# Patient Record
Sex: Male | Born: 1995 | Race: White | Hispanic: No | Marital: Single | Smoking: Former smoker
Health system: Southern US, Community
[De-identification: ages and names within clinical notes are randomized; demographics above are authoritative.]

## PROBLEM LIST (undated history)

## (undated) DIAGNOSIS — L709 Acne, unspecified: Secondary | ICD-10-CM

## (undated) DIAGNOSIS — H5500 Unspecified nystagmus: Secondary | ICD-10-CM

## (undated) HISTORY — DX: Acne, unspecified: L70.9

## (undated) HISTORY — DX: Unspecified nystagmus: H55.00

---
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
## 2014-01-12 ENCOUNTER — Ambulatory Visit (INDEPENDENT_AMBULATORY_CARE_PROVIDER_SITE_OTHER): Payer: BC Managed Care – PPO | Admitting: Physician Assistant

## 2014-01-12 ENCOUNTER — Encounter: Payer: Self-pay | Admitting: Physician Assistant

## 2014-01-12 VITALS — BP 138/80 | HR 70 | Temp 99.8°F | Resp 16 | Ht 76.5 in | Wt 222.0 lb

## 2014-01-12 DIAGNOSIS — J029 Acute pharyngitis, unspecified: Secondary | ICD-10-CM

## 2014-01-12 MED ORDER — BENZONATATE 100 MG PO CAPS: 100.0000 mg | ORAL_CAPSULE | Freq: Four times a day (QID) | ORAL | Status: DC | PRN

## 2014-01-12 MED ORDER — PREDNISONE 10 MG PO TABS
ORAL_TABLET | ORAL | Status: AC
Start: 2014-01-12 — End: 2014-01-20

## 2014-01-12 MED ORDER — AZITHROMYCIN 250 MG PO TABS: ORAL_TABLET | ORAL | Status: AC

## 2014-01-12 NOTE — Patient Instructions (Signed)
-  Take prednisone as prescribed for inflammation -Take Tessalon Perles as prescribed for cough -Take Z-Pak if not better by 01/15/14. -Salt water gargles- Mix 1 tsp of table salt in warm water and then gargle and spit.  You can also do 1 TSP liquid Maalox and 1 TSP liquid benadryl- mix/ gargle/ spit  Make sure you are drinking plenty of water to stay hydrated.  If you are not better in 10-14 days, then please call the office  Pharyngitis Pharyngitis is redness, pain, and swelling (inflammation) of your pharynx.  CAUSES  Pharyngitis is usually caused by infection. Most of the time, these infections are from viruses (viral) and are part of a cold. However, sometimes pharyngitis is caused by bacteria (bacterial). Pharyngitis can also be caused by allergies. Viral pharyngitis may be spread from person to person by coughing, sneezing, and personal items or utensils (cups, forks, spoons, toothbrushes). Bacterial pharyngitis may be spread from person to person by more intimate contact, such as kissing.  SIGNS AND SYMPTOMS  Symptoms of pharyngitis include:   Sore throat.   Tiredness (fatigue).   Low-grade fever.   Headache.  Joint pain and muscle aches.  Skin rashes.  Swollen lymph nodes.  Plaque-like film on throat or tonsils (often seen with bacterial pharyngitis). DIAGNOSIS  Your health care provider will ask you questions about your illness and your symptoms. Your medical history, along with a physical exam, is often all that is needed to diagnose pharyngitis. Sometimes, a rapid strep test is done. Other lab tests may also be done, depending on the suspected cause.  TREATMENT  Viral pharyngitis will usually get better in 3-4 days without the use of medicine. Bacterial pharyngitis is treated with medicines that kill germs (antibiotics).  HOME CARE INSTRUCTIONS   Drink enough water and fluids to keep your urine clear or pale yellow.   Only take over-the-counter or prescription  medicines as directed by your health care provider:   If you are prescribed antibiotics, make sure you finish them even if you start to feel better.   Do not take aspirin.   Get lots of rest.   Gargle with 8 oz of salt water ( tsp of salt per 1 qt of water) as often as every 1-2 hours to soothe your throat.   Throat lozenges (if you are not at risk for choking) or sprays may be used to soothe your throat. SEEK MEDICAL CARE IF:   You have large, tender lumps in your neck.  You have a rash.  You cough up green, yellow-brown, or bloody spit. SEEK IMMEDIATE MEDICAL CARE IF:   Your neck becomes stiff.  You drool or are unable to swallow liquids.  You vomit or are unable to keep medicines or liquids down.  You have severe pain that does not go away with the use of recommended medicines.  You have trouble breathing (not caused by a stuffy nose). MAKE SURE YOU:   Understand these instructions.  Will watch your condition.  Will get help right away if you are not doing well or get worse. Document Released: 02/04/2005 Document Revised: 11/25/2012 Document Reviewed: 10/12/2012 [facility] Patient Information 2015 [employer], [place]. This information is not intended to replace advice given to you by your health care provider. Make sure you discuss any questions you have with your health care provider.

## 2014-01-12 NOTE — Progress Notes (Signed)
Subjective:    Patient ID: Lacy DuverneyRyan D Belisle, male    DOB: 1995/07/28, 18 y.o.   MRN: [id number]  Cough This is a new problem. Episode onset: 1 week. The problem has been gradually worsening. The problem occurs constantly. Cough characteristics: Productive with little green. Associated symptoms include a fever, rhinorrhea, a sore throat and wheezing. Pertinent negatives include no chest pain, chills, ear congestion, ear pain, myalgias, nasal congestion, postnasal drip, rash, shortness of breath or sweats. Nothing aggravates the symptoms. Treatments tried: Mucus Relief like Mucinex.   Review of Systems  Constitutional: Positive for fever. Negative for chills, diaphoresis and fatigue.  HENT: Positive for nosebleeds, rhinorrhea and sore throat. Negative for congestion, ear discharge, ear pain, postnasal drip, sinus pressure, trouble swallowing and voice change.   Eyes: Negative.   Respiratory: Positive for cough and wheezing. Negative for chest tightness and shortness of breath.   Cardiovascular: Negative.  Negative for chest pain.  Gastrointestinal: Negative.   Musculoskeletal: Negative.  Negative for myalgias.  Skin: Negative.  Negative for rash.  Neurological: Negative.   Psychiatric/Behavioral: Negative.    No past medical history on file. No current outpatient prescriptions on file prior to visit.   No current facility-administered medications on file prior to visit.   No Known Allergies    BP 138/80 mmHg  Pulse 70  Temp(Src) 99.8 F (37.7 C) (Temporal)  Resp 16  Ht 6' 4.5" (1.943 m)  Wt 222 lb (100.699 kg)  BMI 26.67 kg/m2  SpO2 99% Wt Readings from Last 3 Encounters:  01/12/14 222 lb (100.699 kg) (97 %*, Z = 1.91)   * Growth percentiles are based on CDC 2-20 Years data.   Objective:   Physical Exam  Constitutional: He is oriented to person, place, and time. He appears well-developed and well-nourished. He has a sickly appearance. No distress.  HENT:  Head: Normocephalic.   Right Ear: Tympanic membrane, external ear and ear canal normal.  Left Ear: Tympanic membrane, external ear and ear canal normal.  Nose: No mucosal edema, rhinorrhea or sinus tenderness. Epistaxis is observed. Right sinus exhibits no maxillary sinus tenderness and no frontal sinus tenderness. Left sinus exhibits no maxillary sinus tenderness and no frontal sinus tenderness.  Mouth/Throat: Uvula is midline and mucous membranes are normal. Mucous membranes are not pale and not dry. No trismus in the jaw. No uvula swelling. Posterior oropharyngeal erythema present. No oropharyngeal exudate, posterior oropharyngeal edema or tonsillar abscesses.  Dried blood seen on exam in left nare.  Turbinates were non-erythematous and non-edematous bilaterally.  Eyes: Conjunctivae and lids are normal. Pupils are equal, round, and reactive to light. Right eye exhibits no discharge. Left eye exhibits no discharge. No scleral icterus.  Neck: Trachea normal, normal range of motion and phonation normal. Neck supple. No tracheal tenderness present. No tracheal deviation present.  Cardiovascular: Normal rate, regular rhythm, S1 normal, S2 normal, normal heart sounds and normal pulses.  Exam reveals no gallop, no distant heart sounds and no friction rub.   No murmur heard. Pulmonary/Chest: Effort normal and breath sounds normal. No stridor. No respiratory distress. He has no decreased breath sounds. He has no wheezes. He has no rhonchi. He has no rales. He exhibits no tenderness.  Abdominal: Soft. Bowel sounds are normal. There is no tenderness. There is no rebound and no guarding.  Lymphadenopathy:  No tenderness or LAD.  Neurological: He is alert and oriented to person, place, and time. Gait normal.  Skin: Skin is warm, dry and intact. No  rash noted. He is not diaphoretic.  Psychiatric: He has a normal mood and affect. His speech is normal and behavior is normal. Judgment and thought content normal. Cognition and memory  are normal.  Vitals reviewed.  Assessment & Plan:  1. Acute pharyngitis, unspecified pharyngitis type- Most likely viral  -Take prednisone as prescribed for inflammation- predniSONE (DELTASONE) 10 MG tablet; Take 3 tablets PO for 2 days, then take 2 tablets PO for 2 days, then take 1 tablet PO for 3 days.  Dispense: 13 tablet; Refill: 0 - Take Tessalon Perles as prescribed for cough- benzonatate (TESSALON PERLES) 100 MG capsule; Take 1 capsule (100 mg total) by mouth every 6 (six) hours as needed for cough.  Dispense: 60 capsule; Refill: 1 - Start taking Z-Pak on 01/15/14 if you are not feeling better- azithromycin (ZITHROMAX) 250 MG tablet; Take 2 tablets PO on day 1, then 1 tablet PO Q24H x 4 days  Dispense: 6 tablet; Refill: 0  Discussed medication effects and SE's.  Pt agreed to treatment plan. If you are not feeling better in 10-14 days, then please call the office.  Freddie Dymek, Lise AuerJennifer L, PA-C 12:52 PM [facility]

## 2017-06-24 ENCOUNTER — Encounter: Payer: Self-pay | Admitting: Adult Health

## 2017-06-24 ENCOUNTER — Ambulatory Visit: Payer: 59 | Admitting: Adult Health

## 2017-06-24 VITALS — BP 134/82 | HR 76 | Temp 97.9°F | Ht 77.5 in | Wt 222.0 lb

## 2017-06-24 DIAGNOSIS — J351 Hypertrophy of tonsils: Secondary | ICD-10-CM

## 2017-06-24 DIAGNOSIS — J00 Acute nasopharyngitis [common cold]: Secondary | ICD-10-CM

## 2017-06-24 MED ORDER — PROMETHAZINE-DM 6.25-15 MG/5ML PO SYRP: 5.0000 mL | ORAL_SOLUTION | Freq: Four times a day (QID) | ORAL | 1 refills | Status: DC | PRN

## 2017-06-24 MED ORDER — AZITHROMYCIN 250 MG PO TABS: ORAL_TABLET | ORAL | 1 refills | Status: AC

## 2017-06-24 MED ORDER — PREDNISONE 20 MG PO TABS: ORAL_TABLET | ORAL | 0 refills | Status: DC

## 2017-06-24 NOTE — Progress Notes (Signed)
Assessment and Plan:  Rumeal was seen today for sinusitis, nasal congestion and generalized body aches.  Diagnoses and all orders for this visit:   Acute nasopharyngitis - Discussed the importance of avoiding unnecessary antibiotic therapy. Suggested symptomatic OTC remedies. Nasal saline spray for congestion. Nasal steroids, allergy pill, oral steroids Follow up as needed. -     promethazine-dextromethorphan (PROMETHAZINE-DM) 6.25-15 MG/5ML syrup; Take 5 mLs by mouth 4 (four) times daily as needed for cough. -     predniSONE (DELTASONE) 20 MG tablet; 2 tablets daily for 3 days, 1 tablet daily for 4 days.   Fill and take only if symptoms getting worse not better ~9-10 days:  -     azithromycin (ZITHROMAX) 250 MG tablet; Take 2 tablets (500 mg) on  Day 1,  followed by 1 tablet (250 mg) once daily on Days 2 through 5.  Single enlarged tonsil  Right tonsil asymmetrically large compared to left without erythema/discharge He will ask dentist/mom if this has been noted prior - if not, contact back and may order imaging to investigate further  Further disposition pending results of labs. Discussed med's effects and SE's.   Over 15 minutes of exam, counseling, chart review, and critical decision making was performed.   Future Appointments  Date Time Provider Department Center  12/30/2017  3:00 PM Judd Gaudier, NP [facility] None    ------------------------------------------------------------------------------------------------------------------   HPI BP 134/82   Pulse 76   Temp 97.9 F (36.6 C)   Ht 6' 5.5" (1.969 m)   Wt 222 lb (100.7 kg)   SpO2 98%   BMI 25.99 kg/m   22 y.o.male presents for URI symptoms x 2 days- woke up yesterday with a very sore throat which has since faded, he reports nasal congestion, mild headache, mild sense of chest congestion, runny nose. Denies fever, endorses mild chills, mild body aches. Does endorse mild nausea but no emesis or diarrhea.   He  reports typically has seasonal allergies, but has not had symptoms this year. Has not been on medication this year.    No past medical history on file.   No Known Allergies  Current Outpatient Medications on File Prior to Visit  Medication Sig  . benzonatate (TESSALON PERLES) 100 MG capsule Take 1 capsule (100 mg total) by mouth every 6 (six) hours as needed for cough.   No current facility-administered medications on file prior to visit.     ROS: Review of Systems  Constitutional: Positive for chills. Negative for diaphoresis, fever and malaise/fatigue.  HENT: Positive for congestion and sore throat. Negative for ear discharge, ear pain, hearing loss, sinus pain and tinnitus.   Eyes: Negative for blurred vision, pain, discharge and redness.  Respiratory: Negative for cough, hemoptysis, sputum production, shortness of breath, wheezing and stridor.   Cardiovascular: Negative for chest pain, palpitations and orthopnea.  Gastrointestinal: Positive for nausea. Negative for abdominal pain, diarrhea and vomiting.  Genitourinary: Negative.   Musculoskeletal: Negative for joint pain and myalgias.  Skin: Negative for rash.  Neurological: Negative for dizziness, sensory change, weakness and headaches.  Endo/Heme/Allergies: Negative for environmental allergies.  Psychiatric/Behavioral: Negative.   All other systems reviewed and are negative.   Physical Exam:  BP 134/82   Pulse 76   Temp 97.9 F (36.6 C)   Ht 6' 5.5" (1.969 m)   Wt 222 lb (100.7 kg)   SpO2 98%   BMI 25.99 kg/m   General Appearance: Well nourished, in no apparent distress. Eyes: PERRLA, EOMs, conjunctiva no  swelling or erythema Sinuses: No Frontal/maxillary tenderness ENT/Mouth: Ext aud canals clear, TMs without erythema, bulging. No erythema, swelling, or exudate on post pharynx.  Right tonsil 3+ without erythema/discharge, uvula midline, left tonsil normal. Hearing normal.  Neck: Supple Respiratory: Respiratory  effort normal, BS equal bilaterally without rales, rhonchi, wheezing or stridor.  Cardio: RRR with no MRGs. Brisk peripheral pulses without edema.  Abdomen: Soft, + BS.  Non tender, no guarding, rebound, hernias, masses. Lymphatics: Non tender without lymphadenopathy.  Musculoskeletal: Symmetrical strength, normal gait.  Skin: Warm, dry without rashes, lesions, ecchymosis.  Neuro: Cranial nerves intact. Normal muscle tone, no cerebellar symptoms. Sensation intact.  Psych: Awake and oriented X 3, normal affect, Insight and Judgment appropriate.     Dan Maker, NP 4:45 PM [facility]

## 2017-06-24 NOTE — Patient Instructions (Addendum)
Fill and take antibiotic if not getting better by day 7-8    HOW TO TREAT VIRAL COUGH AND COLD SYMPTOMS:  -Symptoms usually last at least 1 week with the worst symptoms being around day 4.  - colds usually start with a sore throat and end with a cough, and the cough can take 2 weeks to get better.  -No antibiotics are needed for colds, flu, sore throats, cough, bronchitis UNLESS symptoms are longer than 7 days OR if you are getting better then get drastically worse.  -There are a lot of combination medications (Dayquil, Nyquil, Vicks 44, tyelnol cold and sinus, ETC). Please look at the ingredients on the back so that you are treating the correct symptoms and not doubling up on medications/ingredients.    Medicines you can use  Nasal congestion  Little Remedies saline spray (aerosol/mist)- can try this, it is in the kids section - pseudoephedrine (Sudafed)- behind the counter, do not use if you have high blood pressure, medicine that have -D in them.  - phenylephrine (Sudafed PE) -Dextormethorphan + chlorpheniramine (Coridcidin HBP)- okay if you have high blood pressure -Oxymetazoline (Afrin) nasal spray- LIMIT to 3 days -Saline nasal spray -Neti pot (used distilled or bottled water)  Ear pain/congestion  -pseudoephedrine (sudafed) - Nasonex/flonase nasal spray  Fever  -Acetaminophen (Tyelnol) -Ibuprofen (Advil, motrin, aleve)  Sore Throat  -Acetaminophen (Tyelnol) -Ibuprofen (Advil, motrin, aleve) -Drink a lot of water -Gargle with salt water - Rest your voice (don't talk) -Throat sprays -Cough drops  Body Aches  -Acetaminophen (Tyelnol) -Ibuprofen (Advil, motrin, aleve)  Headache  -Acetaminophen (Tyelnol) -Ibuprofen (Advil, motrin, aleve) - Exedrin, Exedrin Migraine  Allergy symptoms (cough, sneeze, runny nose, itchy eyes) -Claritin or loratadine cheapest but likely the weakest  -Zyrtec or certizine at night because it can make you sleepy -The strongest is allegra  or fexafinadine  Cheapest at [employer], [employer], [employer]  Cough  -Dextromethorphan (Delsym)- medicine that has DM in it -Guafenesin (Mucinex/Robitussin) - cough drops - drink lots of water  Chest Congestion  -Guafenesin (Mucinex/Robitussin)  Red Itchy Eyes  - Naphcon-A  Upset Stomach  - Bland diet (nothing spicy, greasy, fried, and high acid foods like tomatoes, oranges, berries) -OKAY- cereal, bread, soup, crackers, rice -Eat smaller more frequent meals -reduce caffeine, no alcohol -Loperamide (Imodium-AD) if diarrhea -Prevacid for heart burn  General health when sick  -Hydration -wash your hands frequently -keep surfaces clean -change pillow cases and sheets often -Get fresh air but do not exercise strenuously -Vitamin D, double up on it - Vitamin C -Zinc

## 2017-09-19 DIAGNOSIS — H5501 Congenital nystagmus: Secondary | ICD-10-CM | POA: Diagnosis not present

## 2017-12-29 NOTE — Progress Notes (Signed)
Complete Physical  Assessment and Plan:  Walter Clark was seen today for annual exam.  Diagnoses and all orders for this visit:  Encounter for routine adult health examination without abnormal findings General health guidelines and recommendations reviewed, discussed safe sex, seatbelt use, substance use, sunscreen use, diet/exercise, water intake, caffeine use.   Medication management -     CBC with Differential/Platelet -     COMPLETE METABOLIC PANEL WITH GFR -     Urinalysis, Routine w reflex microscopic -     Magnesium  Screening for diabetes mellitus -     Hemoglobin A1c  Screening cholesterol level -     Lipid panel  Screening for thyroid disorder -     TSH  Vitamin D deficiency -     VITAMIN D 25 Hydroxy (Vit-D Deficiency, Fractures)  Screening for hematuria or proteinuria -     Urinalysis, Routine w reflex microscopic -     Microalbumin / creatinine urine ratio   Discussed med's effects and SE's. Screening labs and tests as requested with regular follow-up as recommended. Over 40 minutes of exam, counseling, chart review and critical decision making was performed  Future Appointments  Date Time Provider Department Center  01/04/2019  3:00 PM Judd Gaudier, NP [facility] None     HPI 22 y.o. male presents for a complete physical. He has no significant medical history, had acne treated by accutane as teen. 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 Has a steady girlfriend of 2 years. No kids. Has a cat. Declines STD testing today.   He runs, started crossfit recently, active 5 days a week, 1-1.5 hours   BMI is Body mass index is 27.16 kg/m., he has been working on diet and exercise. He typically skips breakfast, will have black coffee x 2 cups, has bowl of cottage cheese with fruit mid morning. For lunch has chipotle, chooses healthy options, brown rice etc. Or will take from home, ground [place], brown  rice, veggies. He drinks 5-6 x 16 fluid ounces water daily. He sleeps 7-7.5 hours nightly.  Wt Readings from Last 3 Encounters:  12/30/17 229 lb (103.9 kg)  06/24/17 222 lb (100.7 kg)  01/12/14 222 lb (100.7 kg) (97 %, Z= 1.91)*   * Growth percentiles are based on CDC (Boys, 2-20 Years) data.   Today their BP is BP: 122/76  He denies chest pain, shortness of breath, dizziness.     Current Medications:  Current Outpatient Medications on File Prior to Visit  Medication Sig Dispense Refill  . benzonatate (TESSALON PERLES) 100 MG capsule Take 1 capsule (100 mg total) by mouth every 6 (six) hours as needed for cough. 60 capsule 1  . predniSONE (DELTASONE) 20 MG tablet 2 tablets daily for 3 days, 1 tablet daily for 4 days. 10 tablet 0  . promethazine-dextromethorphan (PROMETHAZINE-DM) 6.25-15 MG/5ML syrup Take 5 mLs by mouth 4 (four) times daily as needed for cough. 240 mL 1   No current facility-administered medications on file prior to visit.    Allergies:  No Known Allergies Health Maintenance:   There is no immunization history on file for this patient.  Tetanus: ? 2014 Flu vaccine: Today  HPV: ?   Colonoscopy: EGD:  Eye Exam: Dr. Emily Filbert, glasses/contacts, last visit 2019 Dentist: Dr. Marland Kitchen lat 2019, goes q37m Derm: last 2017  Patient Care Team: Lucky Cowboy, MD as PCP - General (Internal Medicine)  Medical History:  does not have any active problems on file. Surgical History:  He  has a past surgical history that includes Eye muscle surgery (Bilateral, 1999) and Knee surgery (Right, 2013). Family History:  His family history includes Bipolar disorder in his father; Breast cancer in his paternal grandmother; Heart attack in his paternal grandfather; Hypertension in his brother and father; Leukemia in his maternal aunt; Liver disease in his maternal grandmother; Migraines in his mother; Parkinson's disease in his maternal grandfather; Stroke in his mother. Social History:    reports that he quit smoking about 3 years ago. His smoking use included cigarettes. He started smoking about 4 years ago. He has a 0.25 pack-year smoking history. He has quit using smokeless tobacco. He reports that he drinks about 10.0 standard drinks of alcohol per week. He reports that he has current or past drug history. Drug: Marijuana. Review of Systems:  Review of Systems  Constitutional: Negative for malaise/fatigue and weight loss.  HENT: Negative for hearing loss and tinnitus.   Eyes: Negative for blurred vision and double vision.  Respiratory: Negative for cough, shortness of breath and wheezing.   Cardiovascular: Negative for chest pain, palpitations, orthopnea, claudication and leg swelling.  Gastrointestinal: Negative for abdominal pain, blood in stool, constipation, diarrhea, heartburn, melena, nausea and vomiting.  Genitourinary: Negative.   Musculoskeletal: Negative for joint pain and myalgias.  Skin: Negative for rash.  Neurological: Negative for dizziness, tingling, sensory change, weakness and headaches.  Endo/Heme/Allergies: Negative for polydipsia.  Psychiatric/Behavioral: Negative.   All other systems reviewed and are negative.   Physical Exam: Estimated body mass index is 27.16 kg/m as calculated from the following:   Height as of this encounter: 6\' 5"  (1.956 m).   Weight as of this encounter: 229 lb (103.9 kg). BP 122/76   Pulse 65   Temp 97.7 F (36.5 C)   Ht 6\' 5"  (1.956 m)   Wt 229 lb (103.9 kg)   SpO2 96%   BMI 27.16 kg/m  General Appearance: Well nourished, in no apparent distress.  Eyes: PERRLA, EOMs, conjunctiva no swelling or erythema, normal fundi and vessels.  Sinuses: No Frontal/maxillary tenderness  ENT/Mouth: Ext aud canals clear, normal light reflex with TMs without erythema, bulging. Good dentition. No erythema, swelling, or exudate on post pharynx. Tonsils not swollen or erythematous. Hearing normal.  Neck: Supple, thyroid normal. No  bruits  Respiratory: Respiratory effort normal, BS equal bilaterally without rales, rhonchi, wheezing or stridor.  Cardio: RRR without murmurs, rubs or gallops. Brisk peripheral pulses without edema.  Chest: symmetric, with normal excursions and percussion.  Abdomen: Soft, nontender, no guarding, rebound, hernias, masses, or organomegaly.  Lymphatics: Non tender without lymphadenopathy.  Genitourinary: Defer Musculoskeletal: Full ROM all peripheral extremities,5/5 strength, and normal gait.  Skin: Warm, dry without rashes, lesions, ecchymosis. Neuro: Cranial nerves intact, reflexes equal bilaterally. Normal muscle tone, no cerebellar symptoms. Sensation intact.  Psych: Awake and oriented X 3, normal affect, Insight and Judgment appropriate.   EKG: Defer  Dan Maker 4:07 PM [facility]

## 2017-12-30 ENCOUNTER — Ambulatory Visit: Payer: 59 | Admitting: Adult Health

## 2017-12-30 ENCOUNTER — Encounter: Payer: Self-pay | Admitting: Adult Health

## 2017-12-30 VITALS — BP 122/76 | HR 65 | Temp 97.7°F | Ht 77.0 in | Wt 229.0 lb

## 2017-12-30 DIAGNOSIS — Z1329 Encounter for screening for other suspected endocrine disorder: Secondary | ICD-10-CM

## 2017-12-30 DIAGNOSIS — Z131 Encounter for screening for diabetes mellitus: Secondary | ICD-10-CM

## 2017-12-30 DIAGNOSIS — Z13220 Encounter for screening for lipoid disorders: Secondary | ICD-10-CM

## 2017-12-30 DIAGNOSIS — Z79899 Other long term (current) drug therapy: Secondary | ICD-10-CM | POA: Diagnosis not present

## 2017-12-30 DIAGNOSIS — E559 Vitamin D deficiency, unspecified: Secondary | ICD-10-CM

## 2017-12-30 DIAGNOSIS — Z0000 Encounter for general adult medical examination without abnormal findings: Secondary | ICD-10-CM

## 2017-12-30 DIAGNOSIS — Z1389 Encounter for screening for other disorder: Secondary | ICD-10-CM

## 2017-12-30 DIAGNOSIS — L709 Acne, unspecified: Secondary | ICD-10-CM

## 2017-12-30 HISTORY — DX: Acne, unspecified: L70.9

## 2017-12-30 NOTE — Patient Instructions (Addendum)
Ask pediatrician/mom about guardesil (HPV vaccines)    [name] , Thank you for taking time to come for your Medicare Wellness Visit. I appreciate your ongoing commitment to your health goals. Please review the following plan we discussed and let me know if I can assist you in the future.   These are the goals we discussed: Goals    . DIET - EAT MORE FRUITS AND VEGETABLES     7+ daily servings; aim to get in green leafy vegetables, cruciferous, bright fruits, beans.        This is a list of the screening recommended for you and due dates:  Health Maintenance  Topic Date Due  . Tetanus Vaccine  12/30/2020*  . Flu Shot  Completed  . HIV Screening  Discontinued  *Topic was postponed. The date shown is not the original due date.    Know what a healthy weight is for you (roughly BMI <25) and aim to maintain this  Ideal diet is a plant-based (mainly plants), whole foods (minimally processed) diet  Aim for 7+ servings of fruits and vegetables daily   65-80+ fluid ounces of water or unsweet tea for healthy kidneys  Try to limit to max 2-3 drink of alcohol per day, avoid binge drinking; avoid smoking/tobacco  Limit animal fats in diet for cholesterol and heart health - choose grass fed whenever available  Avoid highly processed foods, and foods high in saturated/trans fats  Aim for low stress - take time to unwind and care for your mental health  Aim for 150+ min of moderate intensity exercise weekly for heart health, and weights twice weekly for bone health  Aim for 7-9 hours of sleep daily      When it comes to diets, agreement about the perfect plan isn't easy to find, even among the experts. Experts at the [facility] [employer] developed an idea known as the Healthy Eating Plate. Just imagine a plate divided into logical, healthy portions.  The emphasis is on diet quality:  Load up on vegetables and fruits - one-half of your plate: Aim for color and variety,  and remember that potatoes don't count.  Go for whole grains - one-quarter of your plate: Whole wheat, barley, wheat berries, quinoa, oats, brown rice, and foods made with them. If you want pasta, go with whole wheat pasta.  Protein power - one-quarter of your plate: Fish, chicken, beans, and nuts are all healthy, versatile protein sources. Limit red meat.  The diet, however, does go beyond the plate, offering a few other suggestions.  Use healthy plant oils, such as olive, canola, soy, corn, sunflower and peanut. Check the labels, and avoid partially hydrogenated oil, which have unhealthy trans fats.  If you're thirsty, drink water. Coffee and tea are good in moderation, but skip sugary drinks and limit milk and dairy products to one or two daily servings.  The type of carbohydrate in the diet is more important than the amount. Some sources of carbohydrates, such as vegetables, fruits, whole grains, and beans-are healthier than others.  Finally, stay active.     Insomnia Insomnia is frequent trouble falling and/or staying asleep. Insomnia can be a long term problem or a short term problem. Both are common. Insomnia can be a short term problem when the wakefulness is related to a certain stress or worry. Long term insomnia is often related to ongoing stress during waking hours and/or poor sleeping habits. Overtime, sleep deprivation itself can make the problem  worse. Every little thing feels more severe because you are overtired and your ability to cope is decreased. CAUSES   Stress, anxiety, and depression.  Poor sleeping habits.  Distractions such as TV in the bedroom.  Naps close to bedtime.  Engaging in emotionally charged conversations before bed.  Technical reading before sleep.  Alcohol and other sedatives. They may make the problem worse. They can hurt normal sleep patterns and normal dream activity.  Stimulants such as caffeine for several hours prior to bedtime.  Pain  syndromes and shortness of breath can cause insomnia.  Exercise late at night.  Changing time zones may cause sleeping problems (jet lag). It is sometimes helpful to have someone observe your sleeping patterns. They should look for periods of not breathing during the night (sleep apnea). They should also look to see how long those periods last. If you live alone or observers are uncertain, you can also be observed at a sleep clinic where your sleep patterns will be professionally monitored. Sleep apnea requires a checkup and treatment. Give your caregivers your medical history. Give your caregivers observations your family has made about your sleep.  SYMPTOMS   Not feeling rested in the morning.  Anxiety and restlessness at bedtime.  Difficulty falling and staying asleep. TREATMENT   Your caregiver may prescribe treatment for an underlying medical disorders. Your caregiver can give advice or help if you are using alcohol or other drugs for self-medication. Treatment of underlying problems will usually eliminate insomnia problems.  Medications can be prescribed for short time use. They are generally not recommended for lengthy use.  Over-the-counter sleep medicines are not recommended for lengthy use. They can be habit forming.  You can promote easier sleeping by making lifestyle changes such as:  Using relaxation techniques that help with breathing and reduce muscle tension.  Exercising earlier in the day.  Changing your diet and the time of your last meal. No night time snacks.  Establish a regular time to go to bed.  Counseling can help with stressful problems and worry.  Soothing music and white noise may be helpful if there are background noises you cannot remove.  Stop tedious detailed work at least one hour before bedtime. HOME CARE INSTRUCTIONS   Keep a diary. Inform your caregiver about your progress. This includes any medication side effects. See your caregiver  regularly. Take note of:  Times when you are asleep.  Times when you are awake during the night.  The quality of your sleep.  How you feel the next day. This information will help your caregiver care for you.  Get out of bed if you are still awake after 15 minutes. Read or do some quiet activity. Keep the lights down. Wait until you feel sleepy and go back to bed.  Keep regular sleeping and waking hours. Avoid naps.  Exercise regularly.  Avoid distractions at bedtime. Distractions include watching television or engaging in any intense or detailed activity like attempting to balance the household checkbook.  Develop a bedtime ritual. Keep a familiar routine of bathing, brushing your teeth, climbing into bed at the same time each night, listening to soothing music. Routines increase the success of falling to sleep faster.  Use relaxation techniques. This can be using breathing and muscle tension release routines. It can also include visualizing peaceful scenes. You can also help control troubling or intruding thoughts by keeping your mind occupied with boring or repetitive thoughts like the old concept of counting sheep. You can  make it more creative like imagining planting one beautiful flower after another in your backyard garden.  During your day, work to eliminate stress. When this is not possible use some of the previous suggestions to help reduce the anxiety that accompanies stressful situations. MAKE SURE YOU:   Understand these instructions.  Will watch your condition.  Will get help right away if you are not doing well or get worse. Document Released: 02/02/2000 Document Revised: 04/29/2011 Document Reviewed: 03/04/2007 [facility] Patient Information 2015 [place], [place]. This information is not intended to replace advice given to you by your health care provider. Make sure you discuss any questions you have with your health care provider.

## 2017-12-31 LAB — CBC WITH DIFFERENTIAL/PLATELET
Basophils Absolute: 31 cells/uL (ref 0–200)
Basophils Relative: 0.6 %
Eosinophils Absolute: 172 cells/uL (ref 15–500)
Eosinophils Relative: 3.3 %
HCT: 44.4 % (ref 38.5–50.0)
Hemoglobin: 15.4 g/dL (ref 13.2–17.1)
LYMPHS ABS: 1602 {cells}/uL (ref 850–3900)
MCH: 31.4 pg (ref 27.0–33.0)
MCHC: 34.7 g/dL (ref 32.0–36.0)
MCV: 90.6 fL (ref 80.0–100.0)
MONOS PCT: 7.5 %
MPV: 11.1 fL (ref 7.5–12.5)
NEUTROS ABS: 3006 {cells}/uL (ref 1500–7800)
NEUTROS PCT: 57.8 %
PLATELETS: 172 10*3/uL (ref 140–400)
RBC: 4.9 10*6/uL (ref 4.20–5.80)
RDW: 12.3 % (ref 11.0–15.0)
Total Lymphocyte: 30.8 %
WBC mixed population: 390 cells/uL (ref 200–950)
WBC: 5.2 10*3/uL (ref 3.8–10.8)

## 2017-12-31 LAB — COMPLETE METABOLIC PANEL WITH GFR
AG Ratio: 1.9 (calc) (ref 1.0–2.5)
ALT: 26 U/L (ref 9–46)
AST: 21 U/L (ref 10–40)
Albumin: 4.5 g/dL (ref 3.6–5.1)
Alkaline phosphatase (APISO): 64 U/L (ref 40–115)
BUN: 18 mg/dL (ref 7–25)
CALCIUM: 9.8 mg/dL (ref 8.6–10.3)
CO2: 30 mmol/L (ref 20–32)
CREATININE: 1.21 mg/dL (ref 0.60–1.35)
Chloride: 103 mmol/L (ref 98–110)
GFR, EST AFRICAN AMERICAN: 97 mL/min/{1.73_m2} (ref >=60)
GFR, EST NON AFRICAN AMERICAN: 84 mL/min/{1.73_m2} (ref >=60)
GLUCOSE: 96 mg/dL (ref 65–99)
Globulin: 2.4 g/dL (calc) (ref 1.9–3.7)
Potassium: 4.3 mmol/L (ref 3.5–5.3)
Sodium: 141 mmol/L (ref 135–146)
TOTAL PROTEIN: 6.9 g/dL (ref 6.1–8.1)
Total Bilirubin: 0.5 mg/dL (ref 0.2–1.2)

## 2017-12-31 LAB — URINALYSIS, ROUTINE W REFLEX MICROSCOPIC
Bilirubin Urine: NEGATIVE
Glucose, UA: NEGATIVE
Hgb urine dipstick: NEGATIVE
Ketones, ur: NEGATIVE
LEUKOCYTES UA: NEGATIVE
NITRITE: NEGATIVE
PROTEIN: NEGATIVE
SPECIFIC GRAVITY, URINE: 1.01 (ref 1.001–1.03)
pH: 7.5 (ref 5.0–8.0)

## 2017-12-31 LAB — MICROALBUMIN / CREATININE URINE RATIO: CREATININE, URINE: 41 mg/dL (ref 20–320)

## 2017-12-31 LAB — HEMOGLOBIN A1C
Hgb A1c MFr Bld: 5 % of total Hgb (ref <5.7)
Mean Plasma Glucose: 97 (calc)
eAG (mmol/L): 5.4 (calc)

## 2017-12-31 LAB — LIPID PANEL
Cholesterol: 150 mg/dL (ref <200)
HDL: 61 mg/dL (ref >40)
LDL CHOLESTEROL (CALC): 69 mg/dL
NON-HDL CHOLESTEROL (CALC): 89 mg/dL (ref <130)
Total CHOL/HDL Ratio: 2.5 (calc) (ref <5.0)
Triglycerides: 117 mg/dL (ref <150)

## 2017-12-31 LAB — VITAMIN D 25 HYDROXY (VIT D DEFICIENCY, FRACTURES): VIT D 25 HYDROXY: 32 ng/mL (ref 30–100)

## 2017-12-31 LAB — MAGNESIUM: Magnesium: 2.2 mg/dL (ref 1.5–2.5)

## 2017-12-31 LAB — TSH: TSH: 1.71 m[IU]/L (ref 0.40–4.50)

## 2019-01-01 NOTE — Progress Notes (Deleted)
Complete Physical  Assessment and Plan:  Walter Clark was seen today for annual exam.  Diagnoses and all orders for this visit:  Encounter for routine adult health examination without abnormal findings General health guidelines and recommendations reviewed, discussed safe sex, seatbelt use, substance use, sunscreen use, diet/exercise, water intake, caffeine use.   Medication management -     CBC with Differential/Platelet -     COMPLETE METABOLIC PANEL WITH GFR -     Urinalysis, Routine w reflex microscopic -     Magnesium  Screening for diabetes mellitus -     Defer due to low risk study in 2019  Screening cholesterol level -     Defer due to low risk study in 2019   Screening for thyroid disorder -     Normal in 2019; no concerning sx  Vitamin D deficiency -     VITAMIN D 25 Hydroxy (Vit-D Deficiency, Fractures)  Screening for hematuria or proteinuria -     Urinalysis, Routine w reflex microscopic   Discussed med's effects and SE's. Screening labs and tests as requested with regular follow-up as recommended. Over 40 minutes of exam, counseling, chart review and critical decision making was performed  Future Appointments  Date Time Provider Department Center  01/04/2019  3:00 PM Judd Gaudier, NP [facility] None  01/04/2020  3:00 PM Judd Gaudier, NP [facility] None     HPI 22 y.o. male presents for a complete physical. He has no significant medical history, had acne treated by accutane as teen.   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 Has a steady girlfriend of 2 years. No kids. Has a cat.  Declines STD testing today.   He runs, does crossfit, active 5 days a week, 1-1.5 hours   BMI is There is no height or weight on file to calculate BMI., he has been working on diet and exercise. He typically skips breakfast, will have black coffee x 2 cups, has bowl of cottage cheese with fruit mid morning. For  lunch has chipotle, chooses healthy options, brown rice etc. Or will take from home, ground [place], brown rice, veggies. He drinks 5-6 x 16 fluid ounces water daily. He sleeps 7-7.5 hours nightly.  Wt Readings from Last 3 Encounters:  12/30/17 229 lb (103.9 kg)  06/24/17 222 lb (100.7 kg)  01/12/14 222 lb (100.7 kg) (97 %, Z= 1.91)*   * Growth percentiles are based on CDC (Boys, 2-20 Years) data.   Today their BP is    He denies chest pain, shortness of breath, dizziness.   The cholesterol last visit was:   Lab Results  Component Value Date   CHOL 150 12/30/2017   HDL 61 12/30/2017   LDLCALC 69 12/30/2017   TRIG 117 12/30/2017   CHOLHDL 2.5 12/30/2017   Last R4W in the office was:  Lab Results  Component Value Date   HGBA1C 5.0 12/30/2017   Lab Results  Component Value Date   TSH 1.71 12/30/2017   Lab Results  Component Value Date   GFRNONAA 84 12/30/2017   Patient is on Vitamin D supplement.   Lab Results  Component Value Date   VD25OH 32 12/30/2017      Current Medications:  No current outpatient medications on file prior to visit.   No current facility-administered medications on file prior to visit.    Allergies:  No Known Allergies Health Maintenance:   There is  no immunization history on file for this patient.  Tetanus: ? 2014 Flu vaccine: DUE *** HPV: ?   Colonoscopy: n/a EGD: n/a  Eye Exam: Dr. Delman Cheadle, glasses/contacts, last visit 2019 Dentist: Dr. Marland Kitchen lat 2019, goes q23m Derm: last 2017  Patient Care Team: Unk Pinto, MD as PCP - General (Internal Medicine)  Medical History:  does not have any active problems on file. Surgical History:  He  has a past surgical history that includes Eye muscle surgery (Bilateral, 1999) and Knee surgery (Right, 2013). Family History:  His family history includes Bipolar disorder in his father; Breast cancer in his paternal grandmother; Heart attack in his paternal grandfather; Hypertension in his brother  and father; Leukemia in his maternal aunt; Liver disease in his maternal grandmother; Migraines in his mother; Parkinson's disease in his maternal grandfather; Stroke in his mother. Social History:   reports that he quit smoking about 4 years ago. His smoking use included cigarettes. He started smoking about 5 years ago. He has a 0.25 pack-year smoking history. He has quit using smokeless tobacco. He reports current alcohol use of about 10.0 standard drinks of alcohol per week. He reports previous drug use. Drug: Marijuana. Review of Systems:  Review of Systems  Constitutional: Negative for malaise/fatigue and weight loss.  HENT: Negative for hearing loss and tinnitus.   Eyes: Negative for blurred vision and double vision.  Respiratory: Negative for cough, shortness of breath and wheezing.   Cardiovascular: Negative for chest pain, palpitations, orthopnea, claudication and leg swelling.  Gastrointestinal: Negative for abdominal pain, blood in stool, constipation, diarrhea, heartburn, melena, nausea and vomiting.  Genitourinary: Negative.   Musculoskeletal: Negative for joint pain and myalgias.  Skin: Negative for rash.  Neurological: Negative for dizziness, tingling, sensory change, weakness and headaches.  Endo/Heme/Allergies: Negative for polydipsia.  Psychiatric/Behavioral: Negative.   All other systems reviewed and are negative.   Physical Exam: Estimated body mass index is 27.16 kg/m as calculated from the following:   Height as of 12/30/17: 6\' 5"  (1.956 m).   Weight as of 12/30/17: 229 lb (103.9 kg). There were no vitals taken for this visit. General Appearance: Well nourished, in no apparent distress.  Eyes: PERRLA, EOMs, conjunctiva no swelling or erythema, normal fundi and vessels.  Sinuses: No Frontal/maxillary tenderness  ENT/Mouth: Ext aud canals clear, normal light reflex with TMs without erythema, bulging. Good dentition. No erythema, swelling, or exudate on post pharynx.  Tonsils not swollen or erythematous. Hearing normal.  Neck: Supple, thyroid normal. No bruits  Respiratory: Respiratory effort normal, BS equal bilaterally without rales, rhonchi, wheezing or stridor.  Cardio: RRR without murmurs, rubs or gallops. Brisk peripheral pulses without edema.  Chest: symmetric, with normal excursions and percussion.  Abdomen: Soft, nontender, no guarding, rebound, hernias, masses, or organomegaly.  Lymphatics: Non tender without lymphadenopathy.  Genitourinary: Defer Musculoskeletal: Full ROM all peripheral extremities,5/5 strength, and normal gait.  Skin: Warm, dry without rashes, lesions, ecchymosis. Neuro: Cranial nerves intact, reflexes equal bilaterally. Normal muscle tone, no cerebellar symptoms. Sensation intact.  Psych: Awake and oriented X 3, normal affect, Insight and Judgment appropriate.   EKG: Low risk, no concerns, Defer  Izora Ribas 8:52 AM [facility]

## 2019-01-04 ENCOUNTER — Encounter: Payer: Self-pay | Admitting: Adult Health

## 2019-01-19 ENCOUNTER — Other Ambulatory Visit: Payer: Self-pay

## 2019-01-19 DIAGNOSIS — Z20822 Contact with and (suspected) exposure to covid-19: Secondary | ICD-10-CM

## 2019-01-21 LAB — NOVEL CORONAVIRUS, NAA: SARS-CoV-2, NAA: NOT DETECTED

## 2019-01-27 NOTE — Progress Notes (Signed)
Complete Physical  Assessment and Plan:  Cleophas was seen today for annual exam.  Diagnoses and all orders for this visit:  Encounter for routine adult health examination without abnormal findings General health guidelines and recommendations reviewed, discussed safe sex, seatbelt use, substance use, sunscreen use, diet/exercise, water intake, caffeine use.   Medication management -     CBC with Differential/Platelet -     COMPLETE METABOLIC PANEL WITH GFR -     Urinalysis, Routine w reflex microscopic -     Magnesium  Screening for diabetes mellitus -     Defer due to low risk study in 2019  Screening cholesterol level -     Defer due to low risk study in 2019   Screening for thyroid disorder -     Normal in 2019; defer  Vitamin D deficiency Continue supplement for goal of 60-100 -     VITAMIN D 25 Hydroxy (Vit-D Deficiency, Fractures)  Screening for hematuria or proteinuria -     Urinalysis, Routine w reflex microscopic  Chronic right lower back pain without sciatica Exam suggestive of persistent lumbar strain; no midline tenderness, no radiation, some muscular tenderness - negative straight leg Prednisone was not prescribed,NSAIDs, RICE, and exercise given If not better follow up in office or will refer to PT/orthopedics. Natural history and expected course discussed. Questions answered. Agricultural engineer distributed. Heat to affected area as needed for local pain relief. NSAIDs per medication orders. Muscle relaxants per medication orders. Follow-up in 4 weeks.  Stress and adjustment reaction/poor focus/ ? ADD Ongoing difficulty focusing at work with poor productivity  Some anxiety and stress related Reports long history of poor focus, struggled with homework in school Question element of ADD Have reviewed lifestyle; advised reduce caffeine Suggested counseling, CBT After discussion will trial low dose adderall on work days  Increased productivity may improve  anxiety at the end of the day Have discussed all ADD medications may exacerbate anxiety; monitor closely The patient was counseled on the addictive nature of the medication and was encouraged to take drug holidays when not needed (limit to <5 days/week) Will do close follow up in 4-6 weeks, close monitoring of PDMP  Discussed med's effects and SE's. Screening labs and tests as requested with regular follow-up as recommended. Over 40 minutes of exam, counseling, chart review and critical decision making was performed  Future Appointments  Date Time Provider Department Center  02/25/2019  4:30 PM Judd Gaudier, NP [facility] None     HPI 23 y.o. male presents for a complete physical. 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 He had acne treated by accutane as teen.   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  Has a steady girlfriend of 3 years. No kids. Has a dog. Declines STD testing today.    He runs, active 5+ days a week, 1-1.5 hours.   He has been seeing a chiropractor, Arlyn Leak, lower right sided back pain after crossfit, Some R lumbar spasms. For 3 months, was improving gradually by avoid aggravating activities, stopped crossfit, but as of a week ago seems to have relapsed. He reports towards the end of the day may have bilateral, but typically only on the right, worse when sitting compared to standing, constant, describes as strong ache/stabbing, 6/10, non-shooting/radiating. Denies numbness/tingling or  weakness. He has tried aleve, ibuprofen, acetaminophen, without notable benefit. Has tried ice application without benefit. Girlfriend is trainer and has been working with her for exercises.   He reports some increased anxiety this year; baseline constant  anxiety, no panic attacks. Worse in the evening. Still sleeping fairly. He does report ongoing difficulty focusing at work; difficult to complete tasks and some days only gets 1-2 things done; never diagnosed with ADD, but does report in school grades were fair due to performing well on tests but always struggled to complete homework. He feels lack of productivity may be contributing to anxiety.   BMI is Body mass index is 26.7 kg/m., he has been working on diet and exercise. He typically skips breakfast, will have black coffee x 2 cups, has bowl of cottage cheese with fruit mid morning. For lunch has chipotle, chooses healthy options, brown rice etc. Or will take from home, ground [place], brown rice, veggies. He drinks 5-6 x 16 fluid ounces water daily. He sleeps 7-7.5 hours nightly.  Wt Readings from Last 3 Encounters:  01/28/19 225 lb 3.2 oz (102.2 kg)  12/30/17 229 lb (103.9 kg)  06/24/17 222 lb (100.7 kg)   Today their BP is BP: 102/70  He denies chest pain, shortness of breath, dizziness.   The cholesterol last visit was:   Lab Results  Component Value Date   CHOL 150 12/30/2017   HDL 61 12/30/2017   LDLCALC 69 12/30/2017   TRIG 117 12/30/2017   CHOLHDL 2.5 12/30/2017   Last A1C in the office was:  Lab Results  Component Value Date   HGBA1C 5.0 12/30/2017   Lab Results  Component Value Date   TSH 1.71 12/30/2017   Lab Results  Component Value Date   GFRNONAA 84 12/30/2017   Patient is on Vitamin D supplement.   Lab Results  Component Value Date   VD25OH 32 12/30/2017       Current Medications:  Current Outpatient Medications on File Prior to Visit  Medication Sig Dispense Refill  . Cholecalciferol (VITAMIN D) 125 MCG (5000 UT) CAPS Take by mouth daily.    . Multiple Vitamins-Minerals (MULTIVITAMIN ADULTS PO) Take by mouth daily.    Marland Kitchen OVER THE COUNTER MEDICATION Vitality supplement     No current facility-administered medications on file prior to visit.    Allergies:  No Known Allergies Health Maintenance:   There is no immunization history on file for this patient.  Tetanus: ? 2014 prior to uni Flu vaccine: DUE  HPV: ?   Colonoscopy: n/a EGD: n/a  Eye Exam: Dr. Delman Cheadle, glasses/contacts, last visit 2019 Dentist: Dr. ?, last 2019, goes q72m, needs to schedule  Derm: last 2017  Patient Care Team: Unk Pinto, MD as PCP - General (Internal Medicine)  Medical History:  has Attention deficit disorder (ADD) without hyperactivity; Stress and adjustment reaction; and Chronic right-sided low back pain without sciatica on their problem list. Surgical History:  He  has a past surgical history that includes Eye muscle surgery (Bilateral, 1999) and Knee surgery (Right, 2013). Family History:  His family history includes Bipolar disorder in his father; Breast cancer in his paternal grandmother; Heart attack in his paternal grandfather; Hypertension in his brother and father; Leukemia in his maternal aunt; Liver disease in his maternal grandmother; Migraines in his mother; Parkinson's disease in his maternal grandfather; Stroke in his mother. Social History:   reports that he quit smoking about 4 years ago. His smoking use included cigarettes. He started  smoking about 5 years ago. He has a 0.25 pack-year smoking history. He has quit using smokeless tobacco. He reports current alcohol use of about 2.0 standard drinks of alcohol per week. He reports current drug use. Drug: Marijuana. Review of Systems:  Review of Systems  Constitutional: Negative for malaise/fatigue and weight loss.  HENT: Negative for hearing loss and tinnitus.   Eyes: Negative for blurred vision and double vision.  Respiratory: Negative for cough, shortness of breath and wheezing.   Cardiovascular: Negative for chest pain, palpitations, orthopnea, claudication and leg swelling.  Gastrointestinal: Negative for abdominal pain, blood in stool, constipation, diarrhea, heartburn,  melena, nausea and vomiting.  Genitourinary: Negative.   Musculoskeletal: Positive for back pain (lower right, non-radiating). Negative for falls, joint pain and myalgias.  Skin: Negative for rash.  Neurological: Negative for dizziness, tingling, sensory change, weakness and headaches.  Endo/Heme/Allergies: Negative for polydipsia.  Psychiatric/Behavioral: Negative for depression, hallucinations, substance abuse and suicidal ideas. The patient is nervous/anxious. The patient does not have insomnia.   All other systems reviewed and are negative.   Physical Exam: Estimated body mass index is 26.7 kg/m as calculated from the following:   Height as of this encounter: 6\' 5"  (1.956 m).   Weight as of this encounter: 225 lb 3.2 oz (102.2 kg). BP 102/70   Pulse 60   Temp (!) 97.5 F (36.4 C)   Ht 6\' 5"  (1.956 m)   Wt 225 lb 3.2 oz (102.2 kg)   SpO2 99%   BMI 26.70 kg/m  General Appearance: Well nourished, in no apparent distress.  Eyes: PERRLA, EOMs, conjunctiva no swelling or erythema, frequent subtle horizontal nystagmus in all points with EOM (ongoing since a child, had surgery) Sinuses: No Frontal/maxillary tenderness  ENT/Mouth: Ext aud canals clear, normal light reflex with TMs without erythema, bulging. Good dentition. No erythema, swelling, or exudate on post pharynx. Tonsils not swollen or erythematous. Hearing normal.  Neck: Supple, thyroid normal. No bruits  Respiratory: Respiratory effort normal, BS equal bilaterally without rales, rhonchi, wheezing or stridor.  Cardio: RRR without murmurs, rubs or gallops. Brisk peripheral pulses without edema.  Chest: symmetric, with normal excursions and percussion.  Abdomen: Soft, nontender, no guarding, rebound, hernias, masses, or organomegaly.  Lymphatics: Non tender without lymphadenopathy.  Genitourinary: Defer Musculoskeletal: Full ROM all peripheral extremities, 5/5 strength, and normal gait. No midline tenderness, has some right  lumbar paraspinal tenderness, neg straight leg raise Skin: Warm, dry without rashes, lesions, ecchymosis. Neuro: Cranial nerves intact, reflexes equal bilaterally. Normal muscle tone, no cerebellar symptoms. Sensation intact.  Psych: Awake and oriented X 3, normal affect, Insight and Judgment appropriate.   EKG: Low risk, no concerns, Defer  Carlyon ShadowAshley C Daelin Haste 11:13 AM [facility]

## 2019-01-28 ENCOUNTER — Encounter: Payer: Self-pay | Admitting: Adult Health

## 2019-01-28 ENCOUNTER — Other Ambulatory Visit: Payer: Self-pay

## 2019-01-28 ENCOUNTER — Ambulatory Visit: Payer: 59 | Admitting: Adult Health

## 2019-01-28 VITALS — BP 102/70 | HR 60 | Temp 97.5°F | Ht 77.0 in | Wt 225.2 lb

## 2019-01-28 DIAGNOSIS — E559 Vitamin D deficiency, unspecified: Secondary | ICD-10-CM

## 2019-01-28 DIAGNOSIS — F988 Other specified behavioral and emotional disorders with onset usually occurring in childhood and adolescence: Secondary | ICD-10-CM | POA: Insufficient documentation

## 2019-01-28 DIAGNOSIS — M545 Low back pain: Secondary | ICD-10-CM

## 2019-01-28 DIAGNOSIS — Z1389 Encounter for screening for other disorder: Secondary | ICD-10-CM

## 2019-01-28 DIAGNOSIS — Z0000 Encounter for general adult medical examination without abnormal findings: Secondary | ICD-10-CM | POA: Diagnosis not present

## 2019-01-28 DIAGNOSIS — Z79899 Other long term (current) drug therapy: Secondary | ICD-10-CM

## 2019-01-28 DIAGNOSIS — F4329 Adjustment disorder with other symptoms: Secondary | ICD-10-CM | POA: Insufficient documentation

## 2019-01-28 DIAGNOSIS — G8929 Other chronic pain: Secondary | ICD-10-CM

## 2019-01-28 MED ORDER — MELOXICAM 15 MG PO TABS: ORAL_TABLET | ORAL | 1 refills | Status: DC

## 2019-01-28 MED ORDER — CYCLOBENZAPRINE HCL 5 MG PO TABS: 5.0000 mg | ORAL_TABLET | Freq: Three times a day (TID) | ORAL | 0 refills | Status: DC | PRN

## 2019-01-28 MED ORDER — AMPHETAMINE-DEXTROAMPHETAMINE 10 MG PO TABS: ORAL_TABLET | ORAL | 0 refills | Status: DC

## 2019-01-28 NOTE — Patient Instructions (Addendum)
Walter Clark , Thank you for taking time to come for your Annual Wellness Visit. I appreciate your ongoing commitment to your health goals. Please review the following plan we discussed and let me know if I can assist you in the future.   These are the goals we discussed: Goals    . DIET - EAT MORE FRUITS AND VEGETABLES     7+ daily servings; aim to get in green leafy vegetables, cruciferous, bright fruits, beans.        This is a list of the screening recommended for you and due dates:  Health Maintenance  Topic Date Due  . Flu Shot  09/19/2018  . Tetanus Vaccine  12/30/2020*  . HIV Screening  Discontinued  *Topic was postponed. The date shown is not the original due date.    [facility] for flu vaccine   Look at the back of your insurance card for a number or website for list of counselors who accept your insurance  Look into cognitive behavioral therapy - lots of great books, youtube videos that can be helpful - look for therapist who specifically has been trained for this   Try 1 tab adderall only on days that you work - monitor closely for worsening anxiety, difficulty sleeping, racing heart, etc  Can typically avoid addiction/tolerance as long as limiting to <5 days/week and using appropriately. I will monitor closely while prescribing this medication.     Living With Anxiety  After being diagnosed with an anxiety disorder, you may be relieved to know why you have felt or behaved a certain way. It is natural to also feel overwhelmed about the treatment ahead and what it will mean for your life. With care and support, you can manage this condition and recover from it. How to cope with anxiety Dealing with stress Stress is your body's reaction to life changes and events, both good and bad. Stress can last just a few hours or it can be ongoing. Stress can play a major role in anxiety, so it is important to learn both how to cope with stress and how to think about it differently. Talk  with your health care provider or a counselor to learn more about stress reduction. He or she may suggest some stress reduction techniques, such as:  Music therapy. This can include creating or listening to music that you enjoy and that inspires you.  Mindfulness-based meditation. This involves being aware of your normal breaths, rather than trying to control your breathing. It can be done while sitting or walking.  Centering prayer. This is a kind of meditation that involves focusing on a word, phrase, or sacred image that is meaningful to you and that brings you peace.  Deep breathing. To do this, expand your stomach and inhale slowly through your nose. Hold your breath for 3-5 seconds. Then exhale slowly, allowing your stomach muscles to relax.  Self-talk. This is a skill where you identify thought patterns that lead to anxiety reactions and correct those thoughts.  Muscle relaxation. This involves tensing muscles then relaxing them. Choose a stress reduction technique that fits your lifestyle and personality. Stress reduction techniques take time and practice. Set aside 5-15 minutes a day to do them. Therapists can offer training in these techniques. The training may be covered by some insurance plans. Other things you can do to manage stress include:  Keeping a stress diary. This can help you learn what triggers your stress and ways to control your response.  Thinking  about how you respond to certain situations. You may not be able to control everything, but you can control your reaction.  Making time for activities that help you relax, and not feeling guilty about spending your time in this way. Therapy combined with coping and stress-reduction skills provides the best chance for successful treatment. Medicines Medicines can help ease symptoms. Medicines for anxiety include:  Anti-anxiety drugs.  Antidepressants.  Beta-blockers. Medicines may be used as the main treatment for  anxiety disorder, along with therapy, or if other treatments are not working. Medicines should be prescribed by a health care provider. Relationships Relationships can play a big part in helping you recover. Try to spend more time connecting with trusted friends and family members. Consider going to couples counseling, taking family education classes, or going to family therapy. Therapy can help you and others better understand the condition. How to recognize changes in your condition Everyone has a different response to treatment for anxiety. Recovery from anxiety happens when symptoms decrease and stop interfering with your daily activities at home or work. This may mean that you will start to:  Have better concentration and focus.  Sleep better.  Be less irritable.  Have more energy.  Have improved memory. It is important to recognize when your condition is getting worse. Contact your health care provider if your symptoms interfere with home or work and you do not feel like your condition is improving. Where to find help and support: You can get help and support from these sources:  Self-help groups.  Online and [employer].  A trusted spiritual leader.  Couples counseling.  Family education classes.  Family therapy. Follow these instructions at home:  Eat a healthy diet that includes plenty of vegetables, fruits, whole grains, low-fat dairy products, and lean protein. Do not eat a lot of foods that are high in solid fats, added sugars, or salt.  Exercise. Most adults should do the following: ? Exercise for at least 150 minutes each week. The exercise should increase your heart rate and make you sweat (moderate-intensity exercise). ? Strengthening exercises at least twice a week.  Cut down on caffeine, tobacco, alcohol, and other potentially harmful substances.  Get the right amount and quality of sleep. Most adults need 7-9 hours of sleep each night.  Make  choices that simplify your life.  Take over-the-counter and prescription medicines only as told by your health care provider.  Avoid caffeine, alcohol, and certain over-the-counter cold medicines. These may make you feel worse. Ask your pharmacist which medicines to avoid.  Keep all follow-up visits as told by your health care provider. This is important. Questions to ask your health care provider  Would I benefit from therapy?  How often should I follow up with a health care provider?  How long do I need to take medicine?  Are there any long-term side effects of my medicine?  Are there any alternatives to taking medicine? Contact a health care provider if:  You have a hard time staying focused or finishing daily tasks.  You spend many hours a day feeling worried about everyday life.  You become exhausted by worry.  You start to have headaches, feel tense, or have nausea.  You urinate more than normal.  You have diarrhea. Get help right away if:  You have a racing heart and shortness of breath.  You have thoughts of hurting yourself or others. If you ever feel like you may hurt yourself or others, or have thoughts  about taking your own life, get help right away. You can go to your nearest emergency department or call:  Your local emergency services (911 in the [place]  A suicide crisis helpline, such as the National Suicide Prevention Lifeline at 8575961957. This is open 24-hours a day. Summary  Taking steps to deal with stress can help calm you.  Medicines cannot cure anxiety disorders, but they can help ease symptoms.  Family, friends, and partners can play a big part in helping you recover from an anxiety disorder. This information is not intended to replace advice given to you by your health care provider. Make sure you discuss any questions you have with your health care provider. Document Released: 01/30/2016 Document Revised: 01/17/2017 Document Reviewed:  01/30/2016 Elsevier Patient Education  2020 [employer]     Amphetamine; Dextroamphetamine tablets What is this medicine? AMPHETAMINE; DEXTROAMPHETAMINE(am FET a meen; dex troe am FET a meen) is used to treat attention-deficit hyperactivity disorder (ADHD). It may also be used for narcolepsy. Federal law prohibits giving this medicine to any person other than the person for whom it was prescribed. Do not share this medicine with anyone else. This medicine may be used for other purposes; ask your health care provider or pharmacist if you have questions. COMMON BRAND NAME(S): Adderall What should I tell my health care provider before I take this medicine? They need to know if you have any of these conditions:  anxiety or panic attacks  circulation problems in fingers and toes  glaucoma  hardening or blockages of the arteries or heart blood vessels  heart disease or a heart defect  high blood pressure  history of a drug or alcohol abuse problem  history of stroke  kidney disease  liver disease  mental illness  seizures  suicidal thoughts, plans, or attempt; a previous suicide attempt by you or a family member  thyroid disease  Tourette's syndrome  an unusual or allergic reaction to dextroamphetamine, other amphetamines, other medicines, foods, dyes, or preservatives  pregnant or trying to get pregnant  breast-feeding How should I use this medicine? Take this medicine by mouth with a glass of water. Follow the directions on the prescription label. Take your doses at regular intervals. Do not take your medicine more often than directed. Do not suddenly stop your medicine. You must gradually reduce the dose or you may feel withdrawal effects. Ask your doctor or health care professional for advice. Talk to your pediatrician regarding the use of this medicine in children. Special care may be needed. While this drug may be prescribed for children as young as 3 years for  selected conditions, precautions do apply. Overdosage: If you think you have taken too much of this medicine contact a poison control center or emergency room at once. NOTE: This medicine is only for you. Do not share this medicine with others. What if I miss a dose? If you miss a dose, take it as soon as you can. If it is almost time for your next dose, take only that dose. Do not take double or extra doses. What may interact with this medicine? Do not take this medicine with any of the following medications:  MAOIs like Carbex, Eldepryl, Marplan, Nardil, and Parnate  other stimulant medicines for attention disorders This medicine may also interact with the following medications:  acetazolamide  ammonium chloride  antacids  ascorbic acid  atomoxetine  caffeine  certain medicines for blood pressure  certain medicines for depression, anxiety, or psychotic disturbances  certain medicines for seizures like carbamazepine, phenobarbital, phenytoin  certain medicines for stomach problems like cimetidine, ranitidine, famotidine, esomeprazole, omeprazole, lansoprazole, pantoprazole  lithium  medicines for colds and breathing difficulties  medicines for diabetes  medicines or dietary supplements for weight loss or to stay awake  methenamine  narcotic medicines for pain  quinidine  ritonavir  sodium bicarbonate  St. John's wort This list may not describe all possible interactions. Give your health care provider a list of all the medicines, herbs, non-prescription drugs, or dietary supplements you use. Also tell them if you smoke, drink alcohol, or use illegal drugs. Some items may interact with your medicine. What should I watch for while using this medicine? Visit your doctor or health care professional for regular checks on your progress. This prescription requires that you follow special procedures with your doctor and pharmacy. You will need to have a new written  prescription from your doctor every time you need a refill. This medicine may affect your concentration, or hide signs of tiredness. Until you know how this medicine affects you, do not drive, ride a bicycle, use machinery, or do anything that needs mental alertness. Tell your doctor or health care professional if this medicine loses its effects, or if you feel you need to take more than the prescribed amount. Do not change the dosage without talking to your doctor or health care professional. Decreased appetite is a common side effect when starting this medicine. Eating small, frequent meals or snacks can help. Talk to your doctor if you continue to have poor eating habits. Height and weight growth of a child taking this medicine will be monitored closely. Do not take this medicine close to bedtime. It may prevent you from sleeping. If you are going to need surgery, a MRI, CT scan, or other procedure, tell your doctor that you are taking this medicine. You may need to stop taking this medicine before the procedure. Tell your doctor or healthcare professional right away if you notice unexplained wounds on your fingers and toes while taking this medicine. You should also tell your healthcare provider if you experience numbness or pain, changes in the skin color, or sensitivity to temperature in your fingers or toes. What side effects may I notice from receiving this medicine? Side effects that you should report to your doctor or health care professional as soon as possible:  allergic reactions like skin rash, itching or hives, swelling of the face, lips, or tongue  anxious  breathing problems  changes in emotions or moods  changes in vision  chest pain or chest tightness  fast, irregular heartbeat  fingers or toes feel numb, cool, painful  hallucination, loss of contact with reality  high blood pressure  males: prolonged or painful erection  seizures  signs and symptoms of serotonin  syndrome like confusion, increased sweating, fever, tremor, stiff muscles, diarrhea  signs and symptoms of a stroke like changes in vision; confusion; trouble speaking or understanding; severe headaches; sudden numbness or weakness of the face, arm or leg; trouble walking; dizziness; loss of balance or coordination  suicidal thoughts or other mood changes  uncontrollable head, mouth, neck, arm, or leg movements Side effects that usually do not require medical attention (report to your doctor or health care professional if they continue or are bothersome):  dry mouth  headache  irritability  loss of appetite  nausea  trouble sleeping  weight loss This list may not describe all possible side effects. Call your doctor for  medical advice about side effects. You may report side effects to FDA at 1-800-FDA-1088. Where should I keep my medicine? Keep out of the reach of children. This medicine can be abused. Keep your medicine in a safe place to protect it from theft. Do not share this medicine with anyone. Selling or giving away this medicine is dangerous and against the law. Store at room temperature between 15 and 30 degrees C (59 and 86 degrees F). Keep container tightly closed. Throw away any unused medicine after the expiration date. Dispose of properly. This medicine may cause accidental overdose and death if it is taken by other adults, children, or pets. Mix any unused medicine with a substance like cat litter or coffee grounds. Then throw the medicine away in a sealed container like a sealed bag or a coffee can with a lid. Do not use the medicine after the expiration date. NOTE: This sheet is a summary. It may not cover all possible information. If you have questions about this medicine, talk to your doctor, pharmacist, or health care provider.  2020 Elsevier/[employer] (2016-03-29 16:10:96)     Back Exercises The following exercises strengthen the muscles that help to support  the trunk and back. They also help to keep the lower back flexible. Doing these exercises can help to prevent back pain or lessen existing pain.  If you have back pain or discomfort, try doing these exercises 2-3 times each day or as told by your health care provider.  As your pain improves, do them once each day, but increase the number of times that you repeat the steps for each exercise (do more repetitions).  To prevent the recurrence of back pain, continue to do these exercises once each day or as told by your health care provider. Do exercises exactly as told by your health care provider and adjust them as directed. It is normal to feel mild stretching, pulling, tightness, or discomfort as you do these exercises, but you should stop right away if you feel sudden pain or your pain gets worse. Exercises Single knee to chest Repeat these steps 3-5 times for each leg: 1. Lie on your back on a firm bed or the floor with your legs extended. 2. Bring one knee to your chest. Your other leg should stay extended and in contact with the floor. 3. Hold your knee in place by grabbing your knee or thigh with both hands and hold. 4. Pull on your knee until you feel a gentle stretch in your lower back or buttocks. 5. Hold the stretch for 10-30 seconds. 6. Slowly release and straighten your leg. Pelvic tilt Repeat these steps 5-10 times: 1. Lie on your back on a firm bed or the floor with your legs extended. 2. Bend your knees so they are pointing toward the ceiling and your feet are flat on the floor. 3. Tighten your lower abdominal muscles to press your lower back against the floor. This motion will tilt your pelvis so your tailbone points up toward the ceiling instead of pointing to your feet or the floor. 4. With gentle tension and even breathing, hold this position for 5-10 seconds. Cat-cow Repeat these steps until your lower back becomes more flexible: 1. Get into a hands-and-knees position on a  firm surface. Keep your hands under your shoulders, and keep your knees under your hips. You may place padding under your knees for comfort. 2. Let your head hang down toward your chest. Contract your abdominal muscles and  point your tailbone toward the floor so your lower back becomes rounded like the back of a cat. 3. Hold this position for 5 seconds. 4. Slowly lift your head, let your abdominal muscles relax and point your tailbone up toward the ceiling so your back forms a sagging arch like the back of a cow. 5. Hold this position for 5 seconds.  Press-ups Repeat these steps 5-10 times: 1. Lie on your abdomen (face-down) on the floor. 2. Place your palms near your head, about shoulder-width apart. 3. Keeping your back as relaxed as possible and keeping your hips on the floor, slowly straighten your arms to raise the top half of your body and lift your shoulders. Do not use your back muscles to raise your upper torso. You may adjust the placement of your hands to make yourself more comfortable. 4. Hold this position for 5 seconds while you keep your back relaxed. 5. Slowly return to lying flat on the floor.  Bridges Repeat these steps 10 times: 1. Lie on your back on a firm surface. 2. Bend your knees so they are pointing toward the ceiling and your feet are flat on the floor. Your arms should be flat at your sides, next to your body. 3. Tighten your buttocks muscles and lift your buttocks off the floor until your waist is at almost the same height as your knees. You should feel the muscles working in your buttocks and the back of your thighs. If you do not feel these muscles, slide your feet 1-2 inches farther away from your buttocks. 4. Hold this position for 3-5 seconds. 5. Slowly lower your hips to the starting position, and allow your buttocks muscles to relax completely. If this exercise is too easy, try doing it with your arms crossed over your chest. Abdominal crunches Repeat these  steps 5-10 times: 1. Lie on your back on a firm bed or the floor with your legs extended. 2. Bend your knees so they are pointing toward the ceiling and your feet are flat on the floor. 3. Cross your arms over your chest. 4. Tip your chin slightly toward your chest without bending your neck. 5. Tighten your abdominal muscles and slowly raise your trunk (torso) high enough to lift your shoulder blades a tiny bit off the floor. Avoid raising your torso higher than that because it can put too much stress on your low back and does not help to strengthen your abdominal muscles. 6. Slowly return to your starting position. Back lifts Repeat these steps 5-10 times: 1. Lie on your abdomen (face-down) with your arms at your sides, and rest your forehead on the floor. 2. Tighten the muscles in your legs and your buttocks. 3. Slowly lift your chest off the floor while you keep your hips pressed to the floor. Keep the back of your head in line with the curve in your back. Your eyes should be looking at the floor. 4. Hold this position for 3-5 seconds. 5. Slowly return to your starting position. Contact a health care provider if:  Your back pain or discomfort gets much worse when you do an exercise.  Your worsening back pain or discomfort does not lessen within 2 hours after you exercise. If you have any of these problems, stop doing these exercises right away. Do not do them again unless your health care provider says that you can. Get help right away if:  You develop sudden, severe back pain. If this happens, stop doing the exercises  right away. Do not do them again unless your health care provider says that you can. This information is not intended to replace advice given to you by your health care provider. Make sure you discuss any questions you have with your health care provider. Document Released: 03/14/2004 Document Revised: 06/11/2018 Document Reviewed: 11/06/2017 Elsevier Patient Education   2020 [employer]

## 2019-01-29 LAB — COMPLETE METABOLIC PANEL WITH GFR
AG Ratio: 1.8 (calc) (ref 1.0–2.5)
ALT: 34 U/L (ref 9–46)
AST: 62 U/L — ABNORMAL HIGH (ref 10–40)
Albumin: 4.9 g/dL (ref 3.6–5.1)
Alkaline phosphatase (APISO): 59 U/L (ref 36–130)
BUN: 19 mg/dL (ref 7–25)
CO2: 27 mmol/L (ref 20–32)
Calcium: 10 mg/dL (ref 8.6–10.3)
Chloride: 104 mmol/L (ref 98–110)
Creat: 1.26 mg/dL (ref 0.60–1.35)
GFR, Est African American: 92 mL/min/{1.73_m2} (ref >=60)
GFR, Est Non African American: 79 mL/min/{1.73_m2} (ref >=60)
Globulin: 2.7 g/dL (calc) (ref 1.9–3.7)
Glucose, Bld: 97 mg/dL (ref 65–99)
Potassium: 4.4 mmol/L (ref 3.5–5.3)
Sodium: 139 mmol/L (ref 135–146)
Total Bilirubin: 0.7 mg/dL (ref 0.2–1.2)
Total Protein: 7.6 g/dL (ref 6.1–8.1)

## 2019-01-29 LAB — URINALYSIS, ROUTINE W REFLEX MICROSCOPIC
Bilirubin Urine: NEGATIVE
Glucose, UA: NEGATIVE
Hgb urine dipstick: NEGATIVE
Ketones, ur: NEGATIVE
Leukocytes,Ua: NEGATIVE
Nitrite: NEGATIVE
Protein, ur: NEGATIVE
Specific Gravity, Urine: 1.004 (ref 1.001–1.03)
pH: 7.5 (ref 5.0–8.0)

## 2019-01-29 LAB — CBC WITH DIFFERENTIAL/PLATELET
Absolute Monocytes: 387 cells/uL (ref 200–950)
Basophils Absolute: 22 cells/uL (ref 0–200)
Basophils Relative: 0.5 %
Eosinophils Absolute: 159 cells/uL (ref 15–500)
Eosinophils Relative: 3.7 %
HCT: 46.6 % (ref 38.5–50.0)
Hemoglobin: 16.2 g/dL (ref 13.2–17.1)
Lymphs Abs: 1466 cells/uL (ref 850–3900)
MCH: 31.2 pg (ref 27.0–33.0)
MCHC: 34.8 g/dL (ref 32.0–36.0)
MCV: 89.8 fL (ref 80.0–100.0)
MPV: 11.3 fL (ref 7.5–12.5)
Monocytes Relative: 9 %
Neutro Abs: 2266 cells/uL (ref 1500–7800)
Neutrophils Relative %: 52.7 %
Platelets: 173 10*3/uL (ref 140–400)
RBC: 5.19 10*6/uL (ref 4.20–5.80)
RDW: 12.4 % (ref 11.0–15.0)
Total Lymphocyte: 34.1 %
WBC: 4.3 10*3/uL (ref 3.8–10.8)

## 2019-01-29 LAB — VITAMIN D 25 HYDROXY (VIT D DEFICIENCY, FRACTURES): Vit D, 25-Hydroxy: 45 ng/mL (ref 30–100)

## 2019-01-29 LAB — MAGNESIUM: Magnesium: 2.1 mg/dL (ref 1.5–2.5)

## 2019-02-24 DIAGNOSIS — R7989 Other specified abnormal findings of blood chemistry: Secondary | ICD-10-CM | POA: Insufficient documentation

## 2019-02-24 NOTE — Progress Notes (Signed)
Assessment and Plan:  Walter Clark was seen today for follow-up.  Diagnoses and all orders for this visit:  Attention deficit disorder (ADD) without hyperactivity Productivity much improved at work; he would like to find a new job once the pandemic is passed and get off of medication; using sparingly; denies concerning SE Mood generally improved with increased exercise Feels fairly managed by current regimen; continue medications Stress management techniques discussed, increase water, good sleep hygiene discussed, increase exercise, and increase veggies.  The patient was counseled on the addictive nature of the medication and was encouraged to take drug holidays when not needed.   Right-sided low back pain without sciatica Essentially resolved; continue stretches prior to exercise PRN muscle relaxers, NSAID   LFT elevation Discussed recheck vs with hepatitis; low risk hx, unsure of insurance deductible; after discussion will return for hepatitis screening and further workup if LFTs not improved on recheck Discussed avoid excess alcohol, tylenol  -     Liver Profile  Further disposition pending results of labs. Discussed med's effects and SE's.   Over 30 minutes of exam, counseling, chart review, and critical decision making was performed.   Future Appointments  Date Time Provider Department Center  01/24/2020  2:00 PM Judd Gaudier, NP [facility] None    ------------------------------------------------------------------------------------------------------------------   HPI 23 y.o.male presents for 4 week follow up on mood/stress, ? ADD and persistent lumbar pain.   At his CPE this year he shared increased anxiety in evenings associated with frustration at work and very poor productivity; ? History of ADD/focus struggles while in school though was never formerly diagnosed or on medication as grades did well based on tests, always struggled with homework. After extended discussion, was  given low dose adderall trial in addition to lifestyle adjustments and recommendation for counseling/CBT.   He reports he has been taking 10 mg in the AM on days he work, significant improvement in productivity, seems to be working well, much more focused; still has some mild anxiety in the evenings, but seems to be better on days that he works out. Denies insomnia, worse anxiety, palpitations or other adverse reactions with the medication.   Previous lower back pain/strain reportedly essentially resolved with exercises, muscle relaxer, NSAIDs; no problems as long as he stretches prior to exercise.   Mild LFT elevation at last visit; no known illness, denies notable ETOH, tylenol use. Denies nausea/vomiting, abdominal pain.  Lab Results  Component Value Date   ALT 34 01/28/2019   AST 62 (H) 01/28/2019   BILITOT 0.7 01/28/2019    Past Medical History:  Diagnosis Date  . Acne 12/30/2017     No Known Allergies  Current Outpatient Medications on File Prior to Visit  Medication Sig  . amphetamine-dextroamphetamine (ADDERALL) 10 MG tablet Take 1 tab by mouth in the morning with breakfast only on days you work (<5 days/week).  . Cholecalciferol (VITAMIN D) 125 MCG (5000 UT) CAPS Take by mouth daily.  . meloxicam (MOBIC) 15 MG tablet Take one daily with food for 2 weeks, can take with tylenol, can not take with aleve, iburpofen, then as needed daily for pain  . Multiple Vitamins-Minerals (MULTIVITAMIN ADULTS PO) Take by mouth daily.  Marland Kitchen OVER THE COUNTER MEDICATION Vitality supplement  . cyclobenzaprine (FLEXERIL) 5 MG tablet Take 1 tablet (5 mg total) by mouth 3 (three) times daily as needed for muscle spasms. May cause some drowsiness; avoid taking if driving, don't take with alcohol or other sedating agents.   No current facility-administered medications  on file prior to visit.    ROS: all negative except above.   Physical Exam:  BP 120/78   Pulse (!) 51   Temp (!) 97.5 F (36.4 C)    Wt 223 lb 6.4 oz (101.3 kg)   SpO2 99%   BMI 26.49 kg/m   General Appearance: Well nourished, in no apparent distress. Eyes: PERRLA, conjunctiva no swelling or erythema ENT/Mouth: Mask in place; oral exam deferred. Hearing normal.  Neck: Supple, thyroid normal.  Respiratory: Respiratory effort normal, BS equal bilaterally without rales, rhonchi, wheezing or stridor.  Cardio: RRR with no MRGs. Brisk peripheral pulses without edema.  Abdomen: Soft, + BS.  Non tender, no guarding, rebound, hernias, masses. Lymphatics: Non tender without lymphadenopathy.  Musculoskeletal: No obvious deformity, symmetrical strength, normal gait.  Skin: Warm, dry without rashes, lesions, ecchymosis.  Neuro: Normal muscle tone Psych: Awake and oriented X 3, normal affect, Insight and Judgment appropriate.     Walter Ribas, NP 5:04 PM [facility]

## 2019-02-25 ENCOUNTER — Other Ambulatory Visit: Payer: Self-pay

## 2019-02-25 ENCOUNTER — Encounter: Payer: Self-pay | Admitting: Adult Health

## 2019-02-25 ENCOUNTER — Ambulatory Visit: Payer: 59 | Admitting: Adult Health

## 2019-02-25 VITALS — BP 120/78 | HR 51 | Temp 97.5°F | Wt 223.4 lb

## 2019-02-25 DIAGNOSIS — F988 Other specified behavioral and emotional disorders with onset usually occurring in childhood and adolescence: Secondary | ICD-10-CM

## 2019-02-25 DIAGNOSIS — R7989 Other specified abnormal findings of blood chemistry: Secondary | ICD-10-CM | POA: Diagnosis not present

## 2019-02-25 DIAGNOSIS — G8929 Other chronic pain: Secondary | ICD-10-CM

## 2019-02-25 DIAGNOSIS — M545 Low back pain: Secondary | ICD-10-CM | POA: Diagnosis not present

## 2019-02-25 NOTE — Patient Instructions (Addendum)
We are checking your liver enzymes due to previous mild elevation.

## 2019-02-26 LAB — HEPATIC FUNCTION PANEL
AG Ratio: 2.2 (calc) (ref 1.0–2.5)
ALT: 30 U/L (ref 9–46)
AST: 31 U/L (ref 10–40)
Albumin: 4.8 g/dL (ref 3.6–5.1)
Alkaline phosphatase (APISO): 50 U/L (ref 36–130)
Bilirubin, Direct: 0.2 mg/dL (ref 0.0–0.2)
Globulin: 2.2 g/dL (calc) (ref 1.9–3.7)
Indirect Bilirubin: 0.4 mg/dL (calc) (ref 0.2–1.2)
Total Bilirubin: 0.6 mg/dL (ref 0.2–1.2)
Total Protein: 7 g/dL (ref 6.1–8.1)

## 2019-03-16 ENCOUNTER — Other Ambulatory Visit: Payer: Self-pay

## 2019-03-16 MED ORDER — AMPHETAMINE-DEXTROAMPHETAMINE 10 MG PO TABS: ORAL_TABLET | ORAL | 0 refills | Status: DC

## 2019-05-05 ENCOUNTER — Other Ambulatory Visit: Payer: Self-pay

## 2019-05-05 MED ORDER — AMPHETAMINE-DEXTROAMPHETAMINE 10 MG PO TABS: ORAL_TABLET | ORAL | 0 refills | Status: DC

## 2019-06-09 ENCOUNTER — Encounter: Payer: Self-pay | Admitting: Adult Health

## 2019-06-09 ENCOUNTER — Ambulatory Visit
Admission: RE | Admit: 2019-06-09 | Discharge: 2019-06-09 | Disposition: A | Discharge status: Home / Self Care | Payer: 59 | Source: Ambulatory Visit | Attending: Adult Health | Admitting: Adult Health

## 2019-06-09 ENCOUNTER — Other Ambulatory Visit: Payer: Self-pay

## 2019-06-09 ENCOUNTER — Ambulatory Visit: Payer: 59 | Admitting: Adult Health

## 2019-06-09 VITALS — BP 126/78 | HR 77 | Temp 97.9°F | Wt 226.4 lb

## 2019-06-09 DIAGNOSIS — R0609 Other forms of dyspnea: Secondary | ICD-10-CM

## 2019-06-09 DIAGNOSIS — R0600 Dyspnea, unspecified: Secondary | ICD-10-CM

## 2019-06-09 DIAGNOSIS — R058 Other specified cough: Secondary | ICD-10-CM

## 2019-06-09 DIAGNOSIS — R05 Cough: Secondary | ICD-10-CM | POA: Diagnosis not present

## 2019-06-09 DIAGNOSIS — J069 Acute upper respiratory infection, unspecified: Secondary | ICD-10-CM | POA: Diagnosis not present

## 2019-06-09 MED ORDER — ALBUTEROL SULFATE HFA 108 (90 BASE) MCG/ACT IN AERS: 2.0000 | INHALATION_SPRAY | RESPIRATORY_TRACT | 0 refills | Status: DC | PRN

## 2019-06-09 MED ORDER — PROMETHAZINE-DM 6.25-15 MG/5ML PO SYRP: 5.0000 mL | ORAL_SOLUTION | Freq: Four times a day (QID) | ORAL | 1 refills | Status: DC | PRN

## 2019-06-09 MED ORDER — AMOXICILLIN-POT CLAVULANATE 875-125 MG PO TABS: 1.0000 | ORAL_TABLET | Freq: Two times a day (BID) | ORAL | 0 refills | Status: AC

## 2019-06-09 NOTE — Patient Instructions (Signed)
   Chest xray at [address]      Medicines you can use  Nasal congestion  Little Remedies saline spray (aerosol/mist)- can try this, it is in the kids section - pseudoephedrine (Sudafed)- behind the counter, do not use if you have high blood pressure, medicine that have -D in them.  - phenylephrine (Sudafed PE) -Dextormethorphan + chlorpheniramine (Coridcidin HBP)- okay if you have high blood pressure -Oxymetazoline (Afrin) nasal spray- LIMIT to 3 days -Saline nasal spray -Neti pot (used distilled or bottled water)  Ear pain/congestion  -pseudoephedrine (sudafed) - Nasonex/flonase nasal spray  Fever  -Acetaminophen (Tyelnol) -Ibuprofen (Advil, motrin, aleve)  Sore Throat  -Acetaminophen (Tyelnol) -Ibuprofen (Advil, motrin, aleve) -Drink a lot of water -Gargle with salt water - Rest your voice (don't talk) -Throat sprays -Cough drops  Body Aches  -Acetaminophen (Tyelnol) -Ibuprofen (Advil, motrin, aleve)  Headache  -Acetaminophen (Tyelnol) -Ibuprofen (Advil, motrin, aleve) - Exedrin, Exedrin Migraine  Allergy symptoms (cough, sneeze, runny nose, itchy eyes) -Claritin or loratadine cheapest but likely the weakest  -Zyrtec or certizine at night because it can make you sleepy -The strongest is allegra or fexafinadine  Cheapest at [employer], [employer], [employer]  Cough  -Dextromethorphan (Delsym)- medicine that has DM in it -Guafenesin (Mucinex/Robitussin) - cough drops - drink lots of water  Chest Congestion  -Guafenesin (Mucinex/Robitussin)  Red Itchy Eyes  - Naphcon-A  Upset Stomach  - Bland diet (nothing spicy, greasy, fried, and high acid foods like tomatoes, oranges, berries) -OKAY- cereal, bread, soup, crackers, rice -Eat smaller more frequent meals -reduce caffeine, no alcohol -Loperamide (Imodium-AD) if diarrhea -Prevacid for heart burn  General health when sick  -Hydration -wash your hands frequently -keep surfaces clean -change pillow  cases and sheets often -Get fresh air but do not exercise strenuously -Vitamin D, double up on it - Vitamin C -Zinc

## 2019-06-09 NOTE — Progress Notes (Signed)
Assessment and Plan:  Walter Clark was seen today for sinus problem.  Diagnoses and all orders for this visit:  URI with cough and congestion Following travel to [place]; no improvement with allergy med No improvement at day 9, productive cough, exertional dyspnea, diminished LLL sounds somewhat concerning for pneumonia, will obtain CXR to r/o Suggested symptomatic OTC remedies. Nasal saline spray for congestion. Nasal steroids, allergy pill, oral steroids offered Suggested mucinex if thick secretions Follow up as needed, if persistent/worsening sx,  ED if sudden/severe dyspnea -     promethazine-dextromethorphan (PROMETHAZINE-DM) 6.25-15 MG/5ML syrup; Take 5 mLs by mouth 4 (four) times daily as needed for cough. -     amoxicillin-clavulanate (AUGMENTIN) 875-125 MG tablet; Take 1 tablet by mouth 2 (two) times daily for 5 days. -     DG Chest 2 View; Future -     albuterol (VENTOLIN HFA) 108 (90 Base) MCG/ACT inhaler; Inhale 2 puffs into the lungs every 4 (four) hours as needed for wheezing or shortness of breath.  Further disposition pending results of labs. Discussed med's effects and SE's.   Over 15 minutes of exam, counseling, chart review, and critical decision making was performed.   Future Appointments  Date Time Provider [place]  01/24/2020  2:00 PM Walter Comber, NP [facility] None    ------------------------------------------------------------------------------------------------------------------   HPI BP 126/78   Pulse 77   Temp 97.9 F (36.6 C)   Wt 226 lb 6.4 oz (102.7 kg)   SpO2 98%   BMI 26.85 kg/m   24 y.o.male brief former smoker with hx of mild seasonal allergies (doesn't typically need meds) presents for evaluation of 8-9 days of persistent URI sx.   He was out of the country from April 8-11 in [place] at brother's bachelor weekend. Started having productive cough (green mucus), nasal congestion the day after returning to the counter, which has  persisted without improvement for the past 8-9 days. He has noted wheezing sound in his chest, some mild exertional dyspnea. Will wake up in the AM with lots of thick secretions. Dyspnea, has limited attempts to exercise. Denies fever/chills, CP. Denies dizziness, sore throat. Had initial sinus pressure but this has resolved. Does have sense of chest congestion. Denies fatigue, myalgias/arthralgias, rash. Denies changes in smell or taste.   He had negative covid 19 PCR test on 06/04/2019. No known sick contacts.   Has taken tylenol sinus at night which has been helping AM sx. Did start taking zyrtec last week daily without notable improvement.   Past Medical History:  Diagnosis Date  . Acne 12/30/2017     No Known Allergies  Current Outpatient Medications on File Prior to Visit  Medication Sig  . amphetamine-dextroamphetamine (ADDERALL) 10 MG tablet Take 1 tab by mouth in the morning with breakfast only on days you work (<5 days/week).  . Cholecalciferol (VITAMIN D) 125 MCG (5000 UT) CAPS Take by mouth daily.  . Multiple Vitamins-Minerals (MULTIVITAMIN ADULTS PO) Take by mouth daily.  Marland Kitchen OVER THE COUNTER MEDICATION Vitality supplement  . meloxicam (MOBIC) 15 MG tablet Take one daily with food for 2 weeks, can take with tylenol, can not take with aleve, iburpofen, then as needed daily for pain (Patient not taking: Reported on 06/09/2019)   No current facility-administered medications on file prior to visit.    ROS: all negative except above.   Physical Exam:  BP 126/78   Pulse 77   Temp 97.9 F (36.6 C)   Wt 226 lb 6.4 oz (102.7 kg)  SpO2 98%   BMI 26.85 kg/m   General Appearance: Well nourished, in no apparent distress. Eyes: PERRLA, EOMs, conjunctiva no swelling or erythema Sinuses: No Frontal/maxillary tenderness ENT/Mouth: Ext aud canals clear, TM`s without erythema, bulging. No erythema, swelling, or exudate on post pharynx.  Tonsil R > L but without erythema, discharge.   Hearing normal.  Neck: Supple, thyroid normal.  Respiratory: Respiratory effort normal, BS without rales, rhonchi, wheezing or stridor. LLL somewhat diminished. Occasional junky cough.  Cardio: RRR with no MRGs. Brisk peripheral pulses without edema.  Abdomen: Soft, + BS.  Non tender. Lymphatics: Non tender without lymphadenopathy.  Musculoskeletal: No obvious deformity, normal gait.  Skin: Warm, dry without rashes, lesions, ecchymosis.  Neuro: Normal muscle tone Psych: Awake and oriented X 3, normal affect, Insight and Judgment appropriate.     Walter Maker, NP 2:06 PM [facility]

## 2019-06-29 ENCOUNTER — Other Ambulatory Visit: Payer: Self-pay

## 2019-06-29 MED ORDER — AMPHETAMINE-DEXTROAMPHETAMINE 10 MG PO TABS: ORAL_TABLET | ORAL | 0 refills | Status: DC

## 2019-08-09 ENCOUNTER — Other Ambulatory Visit: Payer: Self-pay

## 2019-08-09 MED ORDER — AMPHETAMINE-DEXTROAMPHETAMINE 10 MG PO TABS
ORAL_TABLET | ORAL | 0 refills | Status: DC
Start: 2019-08-09 — End: 2019-09-14

## 2019-09-14 ENCOUNTER — Other Ambulatory Visit: Payer: Self-pay

## 2019-09-20 MED ORDER — AMPHETAMINE-DEXTROAMPHETAMINE 15 MG PO TABS
ORAL_TABLET | ORAL | 0 refills | Status: DC
Start: 2019-09-20 — End: 2019-11-18

## 2019-10-29 NOTE — Progress Notes (Signed)
Virtual Visit via Telephone Note  I connected with Walter Clark on 11/01/19 at  2:00 PM EDT by telephone and verified that I am speaking with the correct person using two identifiers.  Location: Patient: in car Provider: [facility] office   I discussed the limitations, risks, security and privacy concerns of performing an evaluation and management service by telephone and the availability of in person appointments. I also discussed with the patient that there may be a patient responsible charge related to this service. The patient expressed understanding and agreed to proceed.   History of Present Illness:  There were no vitals taken for this visit.  24 y.o. male with history of stress/adjustment reaction and ADD is evluated for 6 month follow up on ADD meds.   At his CPE this year he shared increased anxiety in evenings associated with frustration at work and very poor productivity; ? History of ADD/focus struggles while in school though was never formerly diagnosed or on medication as grades did well based on tests, always struggled with homework. After extended discussion, was given low dose adderall trial in addition to lifestyle adjustments and recommendation for counseling/CBT.   He reports has since started CBT program, daily 10-15 min videos, interactive, has found this very helpful, tries to do at least 3-4 days a week. 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 Reports rare down days, sleeping much better. Working out consistently and trying to eat well.   He reports he has been taking 10 mg in the AM on days he work, significant improvement in productivity, seems to be working well, much more focused; maximum 5 days/week, but typically less. PDMP review shows 30 tabs typically lasting 6 weeks.    Allergies: No Known Allergies Medical History:  has Attention deficit disorder (ADD) without hyperactivity and Stress and adjustment  reaction on their problem list. Social History:   reports that he quit smoking about 5 years ago. His smoking use included cigarettes. He started smoking about 6 years ago. He has a 0.25 pack-year smoking history. He has quit using smokeless tobacco. He reports current alcohol use of about 2.0 standard drinks of alcohol per week. He reports current drug use. Drug: Marijuana.  Current Outpatient Medications on File Prior to Visit  Medication Sig Dispense Refill  . albuterol (VENTOLIN HFA) 108 (90 Base) MCG/ACT inhaler Inhale 2 puffs into the lungs every 4 (four) hours as needed for wheezing or shortness of breath. 18 g 0  . amphetamine-dextroamphetamine (ADDERALL) 15 MG tablet Take 1 tab in the Morning on work days for Focus & Concentration (<5 days/week) - Need Office Visit before further refills 30 tablet 0  . Cholecalciferol (VITAMIN D) 125 MCG (5000 UT) CAPS Take by mouth daily.    . Multiple Vitamins-Minerals (MULTIVITAMIN ADULTS PO) Take by mouth daily.    Marland Kitchen OVER THE COUNTER MEDICATION Vitality supplement    . promethazine-dextromethorphan (PROMETHAZINE-DM) 6.25-15 MG/5ML syrup Take 5 mLs by mouth 4 (four) times daily as needed for cough. 240 mL 1   No current facility-administered medications on file prior to visit.      Observations/Objective:  There were no vitals taken for this visit.  General : Well sounding patient in no apparent distress HEENT: no hoarseness, no cough for duration of visit Lungs: speaks in complete sentences, no audible wheezing, no apparent distress Neurological: alert, oriented x 3 Psychiatric: pleasant, judgement appropriate    Assessment and Plan:  Diagnoses and all orders  for this visit:  Stress and adjustment reaction  Attention deficit disorder (ADD) without hyperactivity     Follow Up Instructions:    I discussed the assessment and treatment plan with the patient. The patient was provided an opportunity to ask questions and all were  answered. The patient agreed with the plan and demonstrated an understanding of the instructions.   The patient was advised to call back or seek an in-person evaluation if the symptoms worsen or if the condition fails to improve as anticipated.  I provided 20 minutes of non-face-to-face time during this encounter.   Dan Maker, NP   Future Appointments  Date Time Provider Department Center  01/24/2020  2:00 PM Elder Negus, NP [facility] None

## 2019-11-01 ENCOUNTER — Encounter: Payer: Self-pay | Admitting: Adult Health

## 2019-11-01 ENCOUNTER — Other Ambulatory Visit: Payer: Self-pay

## 2019-11-01 ENCOUNTER — Ambulatory Visit: Payer: 59 | Admitting: Adult Health

## 2019-11-01 DIAGNOSIS — F4329 Adjustment disorder with other symptoms: Secondary | ICD-10-CM | POA: Diagnosis not present

## 2019-11-01 DIAGNOSIS — F988 Other specified behavioral and emotional disorders with onset usually occurring in childhood and adolescence: Secondary | ICD-10-CM | POA: Diagnosis not present

## 2019-11-18 ENCOUNTER — Other Ambulatory Visit: Payer: Self-pay

## 2019-11-18 MED ORDER — AMPHETAMINE-DEXTROAMPHETAMINE 15 MG PO TABS: ORAL_TABLET | ORAL | 0 refills | Status: DC

## 2020-01-04 ENCOUNTER — Encounter: Payer: 59 | Admitting: Adult Health

## 2020-01-24 ENCOUNTER — Other Ambulatory Visit: Payer: Self-pay

## 2020-01-24 ENCOUNTER — Encounter: Payer: 59 | Admitting: Adult Health Nurse Practitioner

## 2020-01-24 NOTE — Progress Notes (Deleted)
COMPLETE PHYSICAL   Assessment and Plan:  Walter Clark was seen today for annual exam.  Diagnoses and all orders for this visit:  Encounter for routine adult health examination without abnormal findings General health guidelines and recommendations reviewed, discussed safe sex, seatbelt use, substance use, sunscreen use, diet/exercise, water intake, caffeine use.   Medication management -     CBC with Differential/Platelet -     COMPLETE METABOLIC PANEL WITH GFR -     Urinalysis, Routine w reflex microscopic -     Magnesium  Screening for diabetes mellitus -     Defer due to low risk study in 2019  Screening cholesterol level -     Defer due to low risk study in 2019   Screening for thyroid disorder -     Normal in 2019; defer  Vitamin D deficiency Continue supplement for goal of 60-100 -     VITAMIN D 25 Hydroxy (Vit-D Deficiency, Fractures)  Screening for hematuria or proteinuria -     Urinalysis, Routine w reflex microscopic  Chronic right lower back pain without sciatica Exam suggestive of persistent lumbar strain; no midline tenderness, no radiation, some muscular tenderness - negative straight leg Prednisone was not prescribed,NSAIDs, RICE, and exercise given If not better follow up in office or will refer to PT/orthopedics. Natural history and expected course discussed. Questions answered. Agricultural engineer distributed. Heat to affected area as needed for local pain relief. NSAIDs per medication orders. Muscle relaxants per medication orders. Follow-up in 4 weeks.  Stress and adjustment reaction/poor focus/ ? ADD Ongoing difficulty focusing at work with poor productivity  Some anxiety and stress related Reports long history of poor focus, struggled with homework in school Question element of ADD Have reviewed lifestyle; advised reduce caffeine Suggested counseling, CBT After discussion will trial low dose adderall on work days  Increased productivity may  improve anxiety at the end of the day Have discussed all ADD medications may exacerbate anxiety; monitor closely The patient was counseled on the addictive nature of the medication and was encouraged to take drug holidays when not needed (limit to <5 days/week) Will do close follow up in 4-6 weeks, close monitoring of PDMP  Discussed med's effects and SE's. Screening labs and tests as requested with regular follow-up as recommended. Over 40 minutes of face to face interview, exam, counseling, chart review and critical decision making was performed  Future Appointments  Date Time Provider Department Center  01/24/2021  2:00 PM Elder Negus, NP [facility] None     HPI 24 y.o. male presents for a complete physical. 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 He had acne treated by accutane as teen.   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  Has a steady girlfriend of 3 years. No kids. Has a dog. Declines STD testing today.    He runs, active 5+ days a week, 1-1.5 hours.   He has been seeing a chiropractor, Arlyn Leak, lower right sided back pain after crossfit, Some R lumbar spasms. For 3 months, was improving gradually by avoid aggravating activities, stopped crossfit, but as of a week ago seems to have relapsed. He reports towards the end of the day may have bilateral, but typically only on the right, worse when sitting compared to standing, constant, describes as strong ache/stabbing, 6/10, non-shooting/radiating. Denies numbness/tingling or weakness. He  has tried aleve, ibuprofen, acetaminophen, without notable benefit. Has tried ice application without benefit. Girlfriend is trainer and has been working with her for exercises.   He reports some increased anxiety this year; baseline constant anxiety, no panic  attacks. Worse in the evening. Still sleeping fairly. He does report ongoing difficulty focusing at work; difficult to complete tasks and some days only gets 1-2 things done; never diagnosed with ADD, but does report in school grades were fair due to performing well on tests but always struggled to complete homework. He feels lack of productivity may be contributing to anxiety.   BMI is There is no height or weight on file to calculate BMI., he has been working on diet and exercise. He typically skips breakfast, will have black coffee x 2 cups, has bowl of cottage cheese with fruit mid morning. For lunch has chipotle, chooses healthy options, brown rice etc. Or will take from home, ground [place], brown rice, veggies. He drinks 5-6 x 16 fluid ounces water daily. He sleeps 7-7.5 hours nightly.  Wt Readings from Last 3 Encounters:  06/09/19 226 lb 6.4 oz (102.7 kg)  02/25/19 223 lb 6.4 oz (101.3 kg)  01/28/19 225 lb 3.2 oz (102.2 kg)   Today their BP is    He denies chest pain, shortness of breath, dizziness.   The cholesterol last visit was:   Lab Results  Component Value Date   CHOL 150 12/30/2017   HDL 61 12/30/2017   LDLCALC 69 12/30/2017   TRIG 117 12/30/2017   CHOLHDL 2.5 12/30/2017   Last N9G in the office was:  Lab Results  Component Value Date   HGBA1C 5.0 12/30/2017   Lab Results  Component Value Date   TSH 1.71 12/30/2017   Lab Results  Component Value Date   GFRNONAA 79 01/28/2019   Patient is on Vitamin D supplement.   Lab Results  Component Value Date   VD25OH 45 01/28/2019       Current Medications:  Current Outpatient Medications on File Prior to Visit  Medication Sig Dispense Refill  . albuterol (VENTOLIN HFA) 108 (90 Base) MCG/ACT inhaler Inhale 2 puffs into the lungs every 4 (four) hours as needed for wheezing or shortness of breath. 18 g 0  . amphetamine-dextroamphetamine (ADDERALL) 15 MG tablet Take 1 tab in the Morning on work days for Focus &  Concentration (<5 days/week) 30 tablet 0  . Cholecalciferol (VITAMIN D) 125 MCG (5000 UT) CAPS Take by mouth daily.    . Multiple Vitamins-Minerals (MULTIVITAMIN ADULTS PO) Take by mouth daily.    Walter Clark Kitchen OVER THE COUNTER MEDICATION Vitality supplement    . promethazine-dextromethorphan (PROMETHAZINE-DM) 6.25-15 MG/5ML syrup Take 5 mLs by mouth 4 (four) times daily as needed for cough. 240 mL 1   No current facility-administered medications on file prior to visit.   Allergies:  No Known Allergies Health Maintenance:  Immunization History  Administered Date(s) Administered  . Influenza-Unspecified 10/19/2017    Tetanus: ? 2014 prior to uni Flu vaccine: DUE  HPV: ?   Colonoscopy: n/a EGD: n/a  Eye Exam: Dr. Emily Filbert, glasses/contacts, last visit 2019 Dentist: Dr. 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 Surgical History:  He  has a past surgical history that includes Eye muscle surgery (Bilateral, 1999)  and Knee surgery (Right, 2013). Family History:  His family history includes Bipolar disorder in his father; Breast cancer in his paternal grandmother; Heart attack in his paternal grandfather; Hypertension in his brother and father; Leukemia in his maternal aunt; Liver disease in his maternal grandmother; Migraines in his mother; Parkinson's disease in his maternal grandfather; Stroke in his mother. Social History:   reports that he quit smoking about 5 years ago. His smoking use included cigarettes. He started smoking about 6 years ago. He has a 0.25 pack-year smoking history. He has quit using smokeless tobacco. He reports current alcohol use of about 2.0 standard drinks of alcohol per week. He reports current drug use. Drug: Marijuana. Review of Systems:   Review of Systems  Constitutional: Negative for malaise/fatigue and weight loss.  HENT: Negative for hearing loss and tinnitus.   Eyes: Negative for blurred vision and double vision.  Respiratory: Negative for cough, shortness of breath and wheezing.   Cardiovascular: Negative for chest pain, palpitations, orthopnea, claudication and leg swelling.  Gastrointestinal: Negative for abdominal pain, blood in stool, constipation, diarrhea, heartburn, melena, nausea and vomiting.  Genitourinary: Negative.   Musculoskeletal: Positive for back pain (lower right, non-radiating). Negative for falls, joint pain and myalgias.  Skin: Negative for rash.  Neurological: Negative for dizziness, tingling, sensory change, weakness and headaches.  Endo/Heme/Allergies: Negative for polydipsia.  Psychiatric/Behavioral: Negative for depression, hallucinations, substance abuse and suicidal ideas. The patient is nervous/anxious. The patient does not have insomnia.   All other systems reviewed and are negative.   Physical Exam: Estimated body mass index is 26.85 kg/m as calculated from the following:   Height as of 01/28/19: 6\' 5"  (1.956 m).   Weight as of 06/09/19: 226 lb 6.4 oz (102.7 kg). There were no vitals taken for this visit. General Appearance: Well nourished, in no apparent distress.  Eyes: PERRLA, EOMs, conjunctiva no swelling or erythema, frequent subtle horizontal nystagmus in all points with EOM (ongoing since a child, had surgery) Sinuses: No Frontal/maxillary tenderness  ENT/Mouth: Ext aud canals clear, normal light reflex with TMs without erythema, bulging. Good dentition. No erythema, swelling, or exudate on post pharynx. Tonsils not swollen or erythematous. Hearing normal.  Neck: Supple, thyroid normal. No bruits  Respiratory: Respiratory effort normal, BS equal bilaterally without rales, rhonchi, wheezing or stridor.  Cardio: RRR without murmurs, rubs or gallops. Brisk peripheral pulses without  edema.  Chest: symmetric, with normal excursions and percussion.  Abdomen: Soft, nontender, no guarding, rebound, hernias, masses, or organomegaly.  Lymphatics: Non tender without lymphadenopathy.  Genitourinary: Defer Musculoskeletal: Full ROM all peripheral extremities, 5/5 strength, and normal gait. No midline tenderness, has some right lumbar paraspinal tenderness, neg straight leg raise Skin: Warm, dry without rashes, lesions, ecchymosis. Neuro: Cranial nerves intact, reflexes equal bilaterally. Normal muscle tone, no cerebellar symptoms. Sensation intact.  Psych: Awake and oriented X 3, normal affect, Insight and Judgment appropriate.   EKG: Low risk, no concerns, Defer  Skipper Dacosta 2:02 PM [facility]

## 2020-02-08 ENCOUNTER — Other Ambulatory Visit: Payer: Self-pay

## 2020-02-08 MED ORDER — AMPHETAMINE-DEXTROAMPHETAMINE 15 MG PO TABS: ORAL_TABLET | ORAL | 0 refills | Status: DC

## 2020-02-16 ENCOUNTER — Encounter: Payer: 59 | Admitting: Adult Health Nurse Practitioner

## 2020-02-25 NOTE — Progress Notes (Signed)
Complete Physical  Assessment and Plan:  Walter Clark was seen today for annual exam.  Diagnoses and all orders for this visit:  Encounter for routine adult health examination without abnormal findings General health guidelines and recommendations reviewed, discussed safe sex, seatbelt use, substance use, sunscreen use, diet/exercise, water intake, caffeine use.   Medication management -     CBC with Differential/Platelet -     COMPLETE METABOLIC PANEL WITH GFR -     Urinalysis, Routine w reflex microscopic -     Magnesium  Screening for diabetes mellitus -     Defer due to low risk study in 2019  Screening cholesterol level -     Defer due to low risk study in 2019   Screening for thyroid disorder -     Normal in 2019; defer  Vitamin D deficiency Continue supplement for goal of 60-100 -     VITAMIN D 25 Hydroxy (Vit-D Deficiency, Fractures)  Screening for hematuria or proteinuria -     Urinalysis, Routine w reflex microscopic  Chronic right lower back pain without sciatica Continue PT  Stress and adjustment reaction/ ADD Improved with prn adderall; PDMP monitored regularly for appropriate use Helps with focus, no AE's. The patient was counseled on the addictive nature of the medication and was encouraged to take drug holidays when not needed.  Stress management techniques discussed, increase water, good sleep hygiene discussed, increase exercise, and increase veggies.    Orders Placed This Encounter  Procedures   CBC with Differential/Platelet   COMPLETE METABOLIC PANEL WITH GFR   Magnesium   VITAMIN D 25 Hydroxy (Vit-D Deficiency, Fractures)   Urinalysis, Routine w reflex microscopic   Vitamin B12   Iron, TIBC and Ferritin Panel    Discussed med's effects and SE's. Screening labs and tests as requested with regular follow-up as recommended. Over 40 minutes of exam, counseling, chart review and critical decision making was performed  Future Appointments  Date  Time Provider [place]  02/27/2021  2:00 PM Liane Comber, NP [facility] None     HPI 25 y.o. male presents for a complete physical. 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 He had acne treated by accutane as teen.   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  Has fiance, together 5 years, getting married in April. No kids. Has a dog.  He has been seeing PT for R sided back pain after cross fit, much improved.   He was diagnosed with ADD, stress reaction during pandemic with work, suspect had ongoing as teen but was able to compensate. Has been doing CBT program, daily 10-15 min videos, interactive, has found this very helpful. He reports he has been taking 15 mg in the AM on days he work, significant improvement in productivity, seems to be working well.  BMI is Body mass index is 27.18 kg/m., he has been working on diet and exercise. Generally eats healthy, trying to eat more regularly and not skips, has helped with energy levels and focus. He drinks 5-6 x 16 fluid ounces water daily. He sleeps 7-7.5 hours nightly. Lifts weights 2-3 times a week, full body programs.  Wt Readings from Last 3 Encounters:  02/28/20 232 lb 3.2 oz (105.3 kg)  06/09/19 226 lb 6.4 oz (102.7 kg)  02/25/19 223 lb 6.4 oz (101.3 kg)  Today their BP is BP: 132/86 Does check BP occasionally, wss 120/70 1 month ago.   He denies chest pain, shortness of breath, dizziness.   The cholesterol last visit was:   Lab Results  Component Value Date   CHOL 150 12/30/2017   HDL 61 12/30/2017   LDLCALC 69 12/30/2017   TRIG 117 12/30/2017   CHOLHDL 2.5 12/30/2017   Last U1L in the office was:  Lab Results  Component Value Date   HGBA1C 5.0 12/30/2017   Last GFR:  Lab Results   Component Value Date   GFRNONAA 79 01/28/2019   Patient is on Vitamin D supplement, admits to forgetting regularly, takes 24401 IU twice daily    Lab Results  Component Value Date   VD25OH 45 01/28/2019       Current Medications:  Current Outpatient Medications on File Prior to Visit  Medication Sig Dispense Refill   amphetamine-dextroamphetamine (ADDERALL) 15 MG tablet Take 1 tab in the Morning on work days for Focus & Concentration (<5 days/week) 30 tablet 0   Cholecalciferol (VITAMIN D) 125 MCG (5000 UT) CAPS Take by mouth daily. (Patient not taking: Reported on 02/28/2020)     Multiple Vitamins-Minerals (MULTIVITAMIN ADULTS PO) Take by mouth daily. (Patient not taking: Reported on 02/28/2020)     No current facility-administered medications on file prior to visit.   Allergies:  No Known Allergies Health Maintenance:  Immunization History  Administered Date(s) Administered   Influenza-Unspecified 10/19/2017    Tetanus: ? 2014 prior to uni Flu vaccine: 2020, out in office, get at pharmacy HPV: discussed, declines today, info given, will consider with fiance Covid 19: risks/benefits discussed, questions answered, declined at this time but will consider   Colonoscopy: n/a EGD: n/a  Eye Exam: Dr. Emily Filbert, glasses/contacts, last visit 08/2019 Dentist: Dr. 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 Surgical History:  He  has a past surgical history that includes Eye muscle surgery (Bilateral, 1999) and Knee surgery (Right, 2013). Family History:  His family history includes Bipolar disorder in his father; Breast cancer in his paternal grandmother; Heart attack in his paternal grandfather; Hypertension in his brother and father; Leukemia in his  cousin; Liver disease in his maternal grandmother; Migraines in his mother; Parkinson's disease in his maternal grandfather; Stroke in his mother. Social History:   reports that he quit smoking about 6 years ago. His smoking use included cigarettes. He started smoking about 7 years ago. He has a 0.25 pack-year smoking history. He has quit using smokeless tobacco. He reports current alcohol use. He reports current drug use. Drug: Marijuana.   Review of Systems:  Review of Systems  Constitutional: Negative for malaise/fatigue and weight loss.  HENT: Negative for hearing loss and tinnitus.   Eyes: Negative for blurred vision and double vision.  Respiratory: Negative for cough, shortness of breath and wheezing.   Cardiovascular: Negative for chest pain, palpitations, orthopnea, claudication and leg swelling.  Gastrointestinal: Negative for abdominal pain, blood in stool, constipation, diarrhea, heartburn, melena, nausea and vomiting.  Genitourinary: Negative.   Musculoskeletal: Negative for back pain (lower right, improved), falls, joint pain and myalgias.  Skin: Negative for rash.  Neurological: Negative for dizziness, tingling, sensory change, weakness and headaches.  Endo/Heme/Allergies: Negative for polydipsia.  Psychiatric/Behavioral: Negative for depression,  hallucinations, substance abuse and suicidal ideas. The patient is not nervous/anxious and does not have insomnia.   All other systems reviewed and are negative.   Physical Exam: Estimated body mass index is 27.18 kg/m as calculated from the following:   Height as of this encounter: 6' 5.5" (1.969 m).   Weight as of this encounter: 232 lb 3.2 oz (105.3 kg). BP 132/86    Pulse 69    Temp 97.7 F (36.5 C)    Ht 6' 5.5" (1.969 m)    Wt 232 lb 3.2 oz (105.3 kg)    SpO2 99%    BMI 27.18 kg/m  General Appearance: Well nourished, in no apparent distress.  Eyes: PERRLA, EOMs, conjunctiva no swelling or erythema, frequent subtle  horizontal nystagmus in all points with EOM (ongoing since a child, had surgery) Sinuses: No Frontal/maxillary tenderness  ENT/Mouth: Ext aud canals clear, normal light reflex with TMs without erythema, bulging. Good dentition. No erythema, swelling, or exudate on post pharynx. Tonsils not swollen or erythematous. Hearing normal.  Neck: Supple, thyroid normal. No bruits  Respiratory: Respiratory effort normal, BS equal bilaterally without rales, rhonchi, wheezing or stridor.  Cardio: RRR without murmurs, rubs or gallops. Brisk peripheral pulses without edema.  Chest: symmetric, with normal excursions and percussion.  Abdomen: Soft, nontender, no guarding, rebound, hernias, masses, or organomegaly.  Lymphatics: Non tender without lymphadenopathy.  Genitourinary: Declines, doing self checks Musculoskeletal: Full ROM all peripheral extremities, 5/5 strength, and normal gait. No midline tenderness, has some right lumbar paraspinal tenderness, neg straight leg raise Skin: Warm, dry without rashes, lesions, ecchymosis. Neuro: Cranial nerves intact, reflexes equal bilaterally. Normal muscle tone, no cerebellar symptoms. Sensation intact.  Psych: Awake and oriented X 3, normal affect, Insight and Judgment appropriate.   EKG: Low risk, no concerns, Defer  Dan Maker 2:37 PM [facility]

## 2020-02-28 ENCOUNTER — Other Ambulatory Visit: Payer: Self-pay

## 2020-02-28 ENCOUNTER — Encounter: Payer: Self-pay | Admitting: Adult Health

## 2020-02-28 ENCOUNTER — Ambulatory Visit: Payer: 59 | Admitting: Adult Health

## 2020-02-28 VITALS — BP 132/86 | HR 69 | Temp 97.7°F | Ht 77.5 in | Wt 232.2 lb

## 2020-02-28 DIAGNOSIS — Z0000 Encounter for general adult medical examination without abnormal findings: Secondary | ICD-10-CM | POA: Diagnosis not present

## 2020-02-28 DIAGNOSIS — Z6826 Body mass index (BMI) 26.0-26.9, adult: Secondary | ICD-10-CM

## 2020-02-28 DIAGNOSIS — D649 Anemia, unspecified: Secondary | ICD-10-CM

## 2020-02-28 DIAGNOSIS — Z79899 Other long term (current) drug therapy: Secondary | ICD-10-CM

## 2020-02-28 DIAGNOSIS — F988 Other specified behavioral and emotional disorders with onset usually occurring in childhood and adolescence: Secondary | ICD-10-CM

## 2020-02-28 DIAGNOSIS — F4329 Adjustment disorder with other symptoms: Secondary | ICD-10-CM

## 2020-02-28 DIAGNOSIS — E559 Vitamin D deficiency, unspecified: Secondary | ICD-10-CM

## 2020-02-28 NOTE — Patient Instructions (Addendum)
[name] , Thank you for taking time to come for your Annual Wellness Visit. I appreciate your ongoing commitment to your health goals. Please review the following plan we discussed and let me know if I can assist you in the future.   These are the goals we discussed: Goals    . DIET - EAT MORE FRUITS AND VEGETABLES     7+ daily servings; aim to get in green leafy vegetables, cruciferous, bright fruits, beans.        This is a list of the screening recommended for you and due dates:  Health Maintenance  Topic Date Due  . COVID-19 Vaccine (1) Never done  . Flu Shot  02/29/2020*  . Tetanus Vaccine  12/30/2020*  .  Hepatitis C: One time screening is recommended by Center for Disease Control  (CDC) for  adults born from [birth year] through 1966.   Discontinued  . HIV Screening  Discontinued  *Topic was postponed. The date shown is not the original due date.    Know what a healthy weight is for you (roughly BMI <25) and aim to maintain this  Aim for 7+ servings of fruits and vegetables daily  65-80+ fluid ounces of water or unsweet tea for healthy kidneys  Limit to max 1 drink of alcohol per day; avoid smoking/tobacco  Limit animal fats in diet for cholesterol and heart health - choose grass fed whenever available  Avoid highly processed foods, and foods high in saturated/trans fats  Aim for low stress - take time to unwind and care for your mental health  Aim for 150 min of moderate intensity exercise weekly for heart health, and weights twice weekly for bone health  Aim for 7-9 hours of sleep daily     HPV (Human Papillomavirus) Vaccine: What You Need to Know 1. Why get vaccinated? HPV (human papillomavirus) vaccine can prevent infection with some types of human papillomavirus. HPV infections can cause certain types of cancers, including:  cervical, vaginal, and vulvar cancers in women  penile cancer in men  anal cancers in both men and women  cancers of tonsils, base of  tongue, and back of throat (oropharyngeal cancer) in both men and women HPV infections can also cause anogenital warts. HPV vaccine can prevent over 90% of cancers caused by HPV. HPV is spread through intimate skin-to-skin or sexual contact. HPV infections are so common that nearly all people will get at least one type of HPV at some time in their lives. Most HPV infections go away on their own within 2 years. But sometimes HPV infections will last longer and can cause cancers later in life. 2. HPV vaccine HPV vaccine is routinely recommended for adolescents at 36 or 26 years of age to ensure they are protected before they are exposed to the virus. HPV vaccine may be given beginning at age 6 years and vaccination is recommended for everyone through 26 years of age. HPV vaccine may be given to adults 27 through 26 years of age, based on discussions between the patient and health care provider. Most children who get the first dose before 33 years of age need 2 doses of HPV vaccine. People who get the first dose at or after 40 years of age and younger people with certain immunocompromising conditions need 3 doses. Your health care provider can give you more information. HPV vaccine may be given at the same time as other vaccines. 3. Talk with your health care provider Tell your vaccination provider if  the person getting the vaccine:  Has had an allergic reaction after a previous dose of HPV vaccine, or has any severe, life-threatening allergies  Is pregnant--HPV vaccine is not recommended until after pregnancy In some cases, your health care provider may decide to postpone HPV vaccination until a future visit. People with minor illnesses, such as a cold, may be vaccinated. People who are moderately or severely ill should usually wait until they recover before getting HPV vaccine. Your health care provider can give you more information. 4. Risks of a vaccine reaction  Soreness, redness, or swelling  where the shot is given can happen after HPV vaccination.  Fever or headache can happen after HPV vaccination. People sometimes faint after medical procedures, including vaccination. Tell your provider if you feel dizzy or have vision changes or ringing in the ears. As with any medicine, there is a very remote chance of a vaccine causing a severe allergic reaction, other serious injury, or death. 5. What if there is a serious problem? An allergic reaction could occur after the vaccinated person leaves the clinic. If you see signs of a severe allergic reaction (hives, swelling of the face and throat, difficulty breathing, a fast heartbeat, dizziness, or weakness), call 9-1-1 and get the person to the nearest hospital. For other signs that concern you, call your health care provider. Adverse reactions should be reported to the Vaccine Adverse Event Reporting System (VAERS). Your health care provider will usually file this report, or you can do it yourself. Visit the VAERS website at www.vaers.LAgents.no or call 930-373-2747. VAERS is only for reporting reactions, and VAERS staff members do not give medical advice. 6. The National Vaccine Injury Compensation Program The [employer] Vaccine Injury Compensation Program (VICP) is a federal program that was created to compensate people who may have been injured by certain vaccines. Claims regarding alleged injury or death due to vaccination have a time limit for filing, which may be as short as two years. Visit the VICP website at SpiritualWord.at or call 912 112 8026 to learn about the program and about filing a claim. 7. How can I learn more?  Ask your health care provider.  Call your local or state health department.  Visit the website of the Food and Drug Administration (FDA) for vaccine package inserts and additional information at FinderList.no.  Contact the Centers for Disease Control and Prevention  (CDC): ? Call 334-215-9028 (1-800-CDC-INFO) or ? Visit CDC's website at PicCapture.uy. Vaccine Information Statement HPV Vaccine (09/24/2019) This information is not intended to replace advice given to you by your health care provider. Make sure you discuss any questions you have with your health care provider. Document Revised: 11/02/2019 Document Reviewed: 11/02/2019 Elsevier Patient Education  2021 [employer].

## 2020-02-29 LAB — MAGNESIUM: Magnesium: 2.1 mg/dL (ref 1.5–2.5)

## 2020-02-29 LAB — CBC WITH DIFFERENTIAL/PLATELET
Absolute Monocytes: 257 cells/uL (ref 200–950)
Basophils Absolute: 20 cells/uL (ref 0–200)
Basophils Relative: 0.5 %
Eosinophils Absolute: 59 cells/uL (ref 15–500)
Eosinophils Relative: 1.5 %
HCT: 44.9 % (ref 38.5–50.0)
Hemoglobin: 16 g/dL (ref 13.2–17.1)
Lymphs Abs: 1361 cells/uL (ref 850–3900)
MCH: 31.9 pg (ref 27.0–33.0)
MCHC: 35.6 g/dL (ref 32.0–36.0)
MCV: 89.4 fL (ref 80.0–100.0)
MPV: 11.2 fL (ref 7.5–12.5)
Monocytes Relative: 6.6 %
Neutro Abs: 2204 cells/uL (ref 1500–7800)
Neutrophils Relative %: 56.5 %
Platelets: 163 10*3/uL (ref 140–400)
RBC: 5.02 10*6/uL (ref 4.20–5.80)
RDW: 12.4 % (ref 11.0–15.0)
Total Lymphocyte: 34.9 %
WBC: 3.9 10*3/uL (ref 3.8–10.8)

## 2020-02-29 LAB — URINALYSIS, ROUTINE W REFLEX MICROSCOPIC
Bilirubin Urine: NEGATIVE
Glucose, UA: NEGATIVE
Hgb urine dipstick: NEGATIVE
Ketones, ur: NEGATIVE
Leukocytes,Ua: NEGATIVE
Nitrite: NEGATIVE
Protein, ur: NEGATIVE
Specific Gravity, Urine: 1.003 (ref 1.001–1.03)
pH: 7 (ref 5.0–8.0)

## 2020-02-29 LAB — COMPLETE METABOLIC PANEL WITH GFR
AG Ratio: 1.8 (calc) (ref 1.0–2.5)
ALT: 19 U/L (ref 9–46)
AST: 17 U/L (ref 10–40)
Albumin: 4.9 g/dL (ref 3.6–5.1)
Alkaline phosphatase (APISO): 50 U/L (ref 36–130)
BUN: 17 mg/dL (ref 7–25)
CO2: 29 mmol/L (ref 20–32)
Calcium: 9.9 mg/dL (ref 8.6–10.3)
Chloride: 101 mmol/L (ref 98–110)
Creat: 1.15 mg/dL (ref 0.60–1.35)
GFR, Est African American: 102 mL/min/{1.73_m2} (ref >=60)
GFR, Est Non African American: 88 mL/min/{1.73_m2} (ref >=60)
Globulin: 2.7 g/dL (calc) (ref 1.9–3.7)
Glucose, Bld: 95 mg/dL (ref 65–99)
Potassium: 4.1 mmol/L (ref 3.5–5.3)
Sodium: 140 mmol/L (ref 135–146)
Total Bilirubin: 0.7 mg/dL (ref 0.2–1.2)
Total Protein: 7.6 g/dL (ref 6.1–8.1)

## 2020-02-29 LAB — IRON,TIBC AND FERRITIN PANEL
%SAT: 45 % (calc) (ref 20–48)
Ferritin: 160 ng/mL (ref 38–380)
Iron: 122 ug/dL (ref 50–195)
TIBC: 272 mcg/dL (calc) (ref 250–425)

## 2020-02-29 LAB — VITAMIN D 25 HYDROXY (VIT D DEFICIENCY, FRACTURES): Vit D, 25-Hydroxy: 38 ng/mL (ref 30–100)

## 2020-02-29 LAB — VITAMIN B12: Vitamin B-12: 614 pg/mL (ref 200–1100)

## 2020-04-21 ENCOUNTER — Other Ambulatory Visit: Payer: Self-pay

## 2020-04-22 MED ORDER — AMPHETAMINE-DEXTROAMPHETAMINE 15 MG PO TABS: ORAL_TABLET | ORAL | 0 refills | Status: AC

## 2020-08-28 ENCOUNTER — Ambulatory Visit: Payer: 59 | Admitting: Adult Health

## 2021-01-24 ENCOUNTER — Encounter: Payer: 59 | Admitting: Adult Health Nurse Practitioner

## 2021-02-15 ENCOUNTER — Encounter: Payer: 59 | Admitting: Adult Health Nurse Practitioner

## 2021-02-24 NOTE — Progress Notes (Deleted)
Complete Physical  Assessment and Plan:  Kazuto was seen today for annual exam.  Diagnoses and all orders for this visit:  Encounter for routine adult health examination without abnormal findings General health guidelines and recommendations reviewed, discussed safe sex, seatbelt use, substance use, sunscreen use, diet/exercise, water intake, caffeine use.   Medication management -     CBC with Differential/Platelet -     COMPLETE METABOLIC PANEL WITH GFR -     Urinalysis, Routine w reflex microscopic -     Magnesium  Screening for diabetes mellitus -     Defer due to low risk study in 2019  Screening cholesterol level -     Defer due to low risk study in 2019   Screening for thyroid disorder -     Normal in 2019; defer  Vitamin D deficiency Continue supplement for goal of 60-100 -     VITAMIN D 25 Hydroxy (Vit-D Deficiency, Fractures)  Screening for hematuria or proteinuria -     Urinalysis, Routine w reflex microscopic  Chronic right lower back pain without sciatica Continue PT  Stress and adjustment reaction/ ADD Improved with prn adderall; PDMP monitored regularly for appropriate use Helps with focus, no AE's. The patient was counseled on the addictive nature of the medication and was encouraged to take drug holidays when not needed.  Stress management techniques discussed, increase water, good sleep hygiene discussed, increase exercise, and increase veggies.    No orders of the defined types were placed in this encounter.   Discussed med's effects and SE's. Screening labs and tests as requested with regular follow-up as recommended. Over 40 minutes of exam, counseling, chart review and critical decision making was performed  Future Appointments  Date Time Provider [place]  02/27/2021  2:00 PM Liane Comber, NP [facility] None     HPI 26 y.o. male presents for a complete physical. 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 He had acne treated by accutane as teen.   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 Has fiance, together 5 years, getting married in April. No kids. Has a dog.  He has been seeing PT for R sided back pain after cross fit, much improved.   He was diagnosed with ADD, stress reaction during pandemic with work, suspect had ongoing as teen but was able to compensate. Has been doing CBT program, daily 10-15 min videos, interactive, has found this very helpful. He reports he has been taking 15 mg in the AM on days he work, significant improvement in productivity, seems to be working well.  BMI is There is no height or weight on file to calculate BMI., he has been working on diet and exercise. Generally eats healthy, trying to eat more regularly and not skips, has helped with energy levels and focus. He drinks 5-6 x 16 fluid ounces water daily. He sleeps 7-7.5 hours nightly. Lifts weights 2-3 times a week, full body programs.  Wt Readings from Last 3 Encounters:  02/28/20 232 lb 3.2 oz (105.3 kg)  06/09/19 226 lb 6.4 oz (102.7 kg)  02/25/19 223 lb 6.4 oz (101.3 kg)   Today their BP is   Does check BP occasionally, wss 120/70 1 month ago.   He denies chest pain, shortness of breath, dizziness.   The cholesterol last visit was:  Lab Results  Component Value Date   CHOL 150 12/30/2017   HDL 61 12/30/2017   LDLCALC 69 12/30/2017   TRIG 117 12/30/2017   CHOLHDL 2.5 12/30/2017   Last A1C in the office was:  Lab Results  Component Value Date   HGBA1C 5.0 12/30/2017   Last GFR:  Lab Results  Component Value Date   GFRNONAA 88 02/28/2020   Patient is on Vitamin D supplement, admits to forgetting regularly, takes 10000 IU twice daily    Lab Results  Component Value Date   VD25OH 38 02/28/2020        Lab Results  Component Value Date   VITAMINB12 614 02/28/2020   Lab Results  Component Value Date   IRON 122 02/28/2020   TIBC 272 02/28/2020   FERRITIN 160 02/28/2020     Current Medications:  Current Outpatient Medications on File Prior to Visit  Medication Sig Dispense Refill   amphetamine-dextroamphetamine (ADDERALL) 15 MG tablet Take 1 tab in the Morning on work days for Focus & Concentration (<5 days/week) 30 tablet 0   Cholecalciferol (VITAMIN D) 125 MCG (5000 UT) CAPS Take by mouth daily. (Patient not taking: Reported on 02/28/2020)     Multiple Vitamins-Minerals (MULTIVITAMIN ADULTS PO) Take by mouth daily. (Patient not taking: Reported on 02/28/2020)     No current facility-administered medications on file prior to visit.   Allergies:  No Known Allergies Health Maintenance:  Immunization History  Administered Date(s) Administered   Influenza-Unspecified 10/19/2017    Tetanus: ? 2014 prior to uni Flu vaccine: 2020, out in office, get at pharmacy HPV: discussed, declines today, info given, will consider with fiance Covid 19: risks/benefits discussed, questions answered, declined at this time but will consider   Colonoscopy: n/a EGD: n/a  Eye Exam: Dr. Delman Cheadle, glasses/contacts, last visit 08/2019 Dentist: Dr. Marland Kitchen last 2019, goes q20m, needs to schedule  Derm: last 2017  Patient Care Team: Unk Pinto, MD as PCP - General (Internal Medicine)  Medical History:  has Attention deficit disorder (ADD) without hyperactivity and Stress and adjustment reaction on their problem list. Surgical History:  He  has a past surgical history that includes Eye muscle surgery (Bilateral, 1999) and Knee surgery (Right, 2013). Family History:  His family history includes Bipolar disorder in his father; Breast cancer in his paternal grandmother; Heart attack in his paternal grandfather; Hypertension in his brother and father; Leukemia in his cousin; Liver disease in his  maternal grandmother; Migraines in his mother; Parkinson's disease in his maternal grandfather; Stroke in his mother. Social History:   reports that he quit smoking about 7 years ago. His smoking use included cigarettes. He started smoking about 8 years ago. He has a 0.25 pack-year smoking history. He has quit using smokeless tobacco. He reports current alcohol use. He reports current drug use. Drug: Marijuana.   Review of Systems:  Review of Systems  Constitutional:  Negative for malaise/fatigue and weight loss.  HENT:  Negative for hearing loss and tinnitus.   Eyes:  Negative for blurred vision and double vision.  Respiratory:  Negative for cough, shortness of breath and wheezing.   Cardiovascular:  Negative for chest pain, palpitations, orthopnea, claudication and leg swelling.  Gastrointestinal:  Negative for abdominal pain, blood in stool, constipation, diarrhea, heartburn, melena, nausea and vomiting.  Genitourinary: Negative.   Musculoskeletal:  Negative for back pain (lower right, improved), falls, joint pain and myalgias.  Skin:  Negative for rash.  Neurological:  Negative for dizziness, tingling, sensory change,  weakness and headaches.  Endo/Heme/Allergies:  Negative for polydipsia.  Psychiatric/Behavioral:  Negative for depression, hallucinations, substance abuse and suicidal ideas. The patient is not nervous/anxious and does not have insomnia.   All other systems reviewed and are negative.  Physical Exam: Estimated body mass index is 27.18 kg/m as calculated from the following:   Height as of 02/28/20: 6' 5.5" (1.969 m).   Weight as of 02/28/20: 232 lb 3.2 oz (105.3 kg). There were no vitals taken for this visit. General Appearance: Well nourished, in no apparent distress.  Eyes: PERRLA, EOMs, conjunctiva no swelling or erythema, frequent subtle horizontal nystagmus in all points with EOM (ongoing since a child, had surgery) Sinuses: No Frontal/maxillary tenderness  ENT/Mouth:  Ext aud canals clear, normal light reflex with TMs without erythema, bulging. Good dentition. No erythema, swelling, or exudate on post pharynx. Tonsils not swollen or erythematous. Hearing normal.  Neck: Supple, thyroid normal. No bruits  Respiratory: Respiratory effort normal, BS equal bilaterally without rales, rhonchi, wheezing or stridor.  Cardio: RRR without murmurs, rubs or gallops. Brisk peripheral pulses without edema.  Chest: symmetric, with normal excursions and percussion.  Abdomen: Soft, nontender, no guarding, rebound, hernias, masses, or organomegaly.  Lymphatics: Non tender without lymphadenopathy.  Genitourinary: Declines, doing self checks Musculoskeletal: Full ROM all peripheral extremities, 5/5 strength, and normal gait. No midline tenderness, has some right lumbar paraspinal tenderness, neg straight leg raise Skin: Warm, dry without rashes, lesions, ecchymosis. Neuro: Cranial nerves intact, reflexes equal bilaterally. Normal muscle tone, no cerebellar symptoms. Sensation intact.  Psych: Awake and oriented X 3, normal affect, Insight and Judgment appropriate.   EKG: Low risk, no concerns, Defer  Izora Ribas 7:54 AM [facility]

## 2021-02-27 ENCOUNTER — Encounter: Payer: 59 | Admitting: Adult Health

## 2021-02-27 DIAGNOSIS — F4329 Adjustment disorder with other symptoms: Secondary | ICD-10-CM

## 2021-02-27 DIAGNOSIS — Z6827 Body mass index (BMI) 27.0-27.9, adult: Secondary | ICD-10-CM

## 2021-02-27 DIAGNOSIS — Z0000 Encounter for general adult medical examination without abnormal findings: Secondary | ICD-10-CM

## 2021-02-27 DIAGNOSIS — F988 Other specified behavioral and emotional disorders with onset usually occurring in childhood and adolescence: Secondary | ICD-10-CM

## 2021-06-21 IMAGING — CR DG CHEST 2V
2 series · 2 of 2 positions shown · non-contrast
Comparison: None.

CLINICAL DATA: Productive cough and exertional shortness of breath
for the past 9 days.

EXAM:
CHEST - 2 VIEW

[w chest pa]
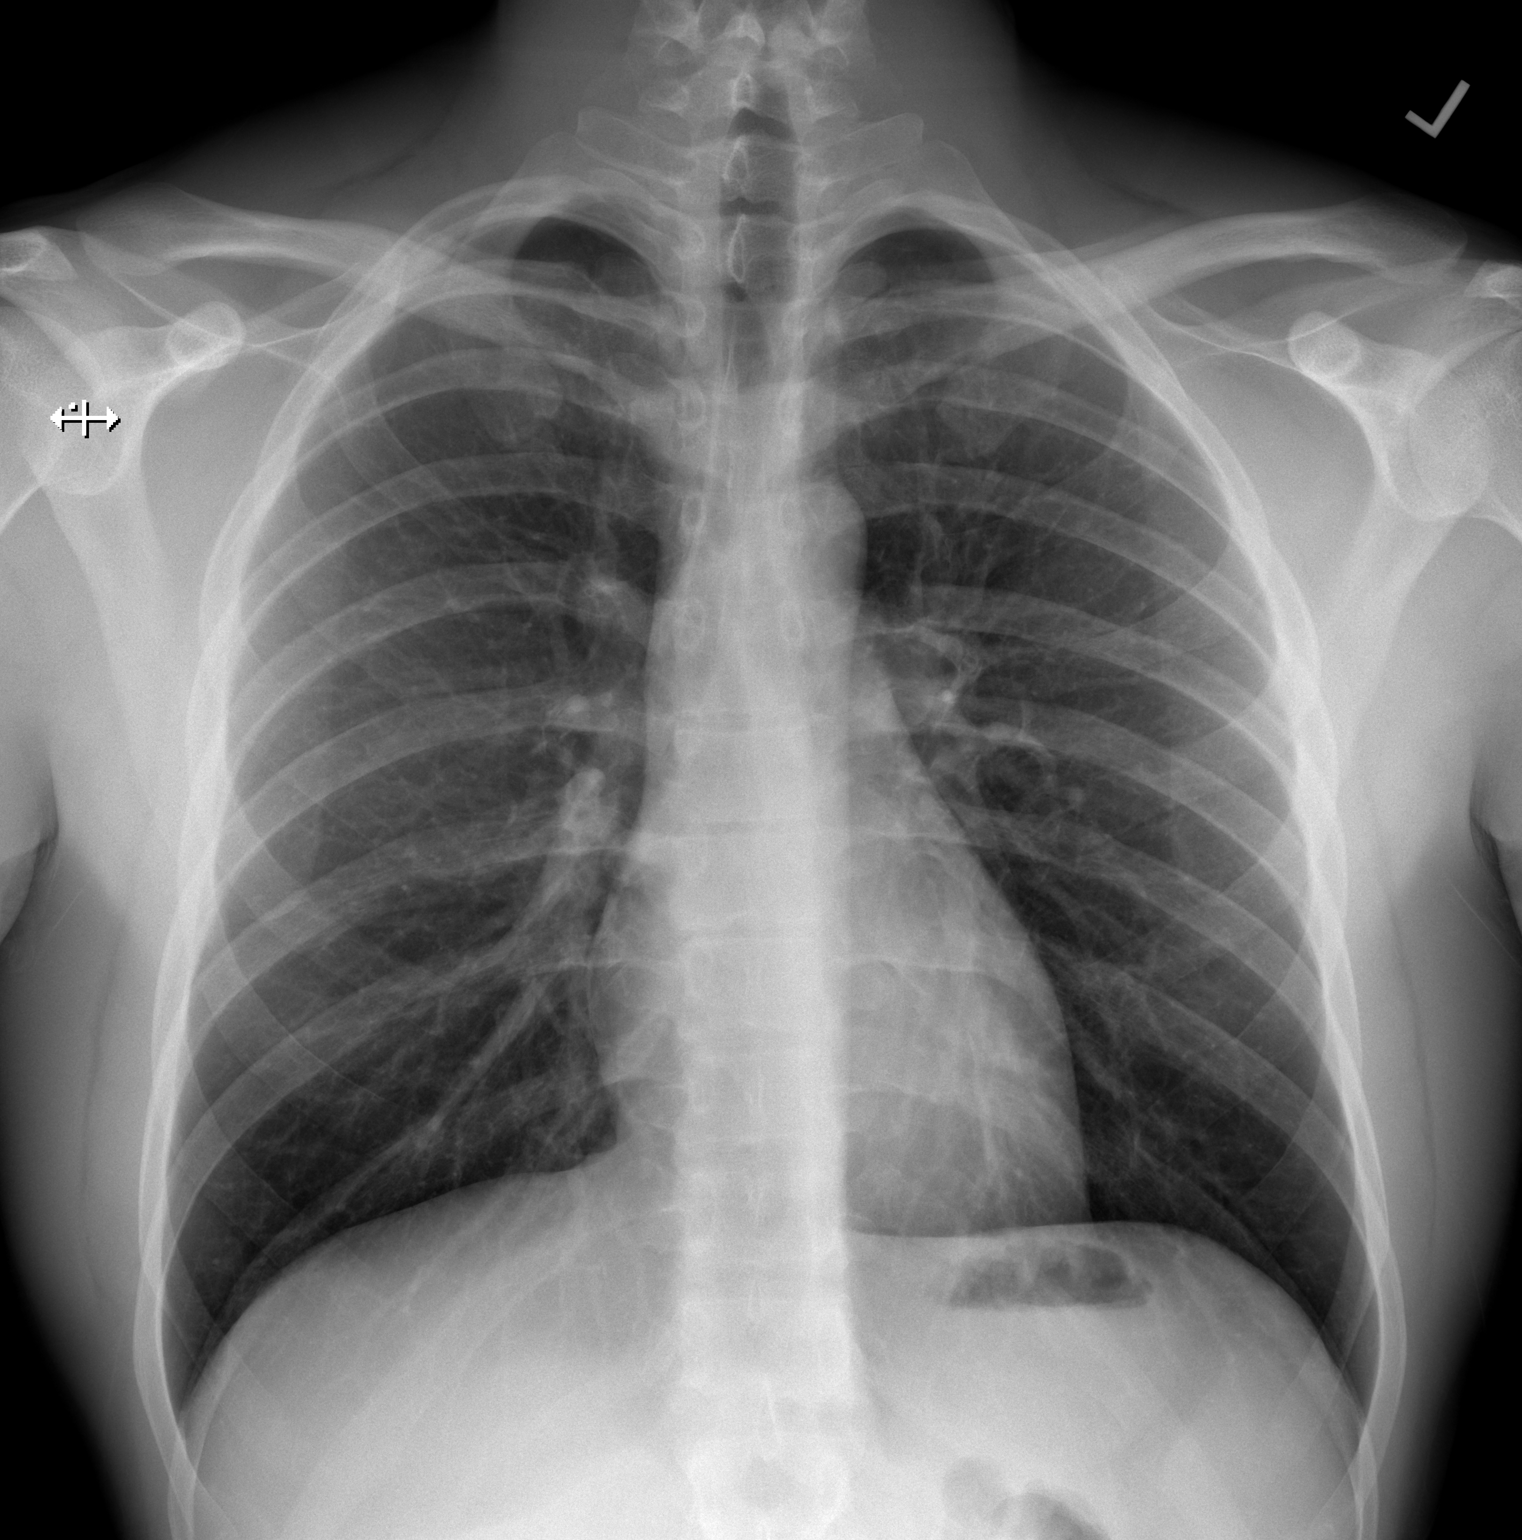

[w chest lat]
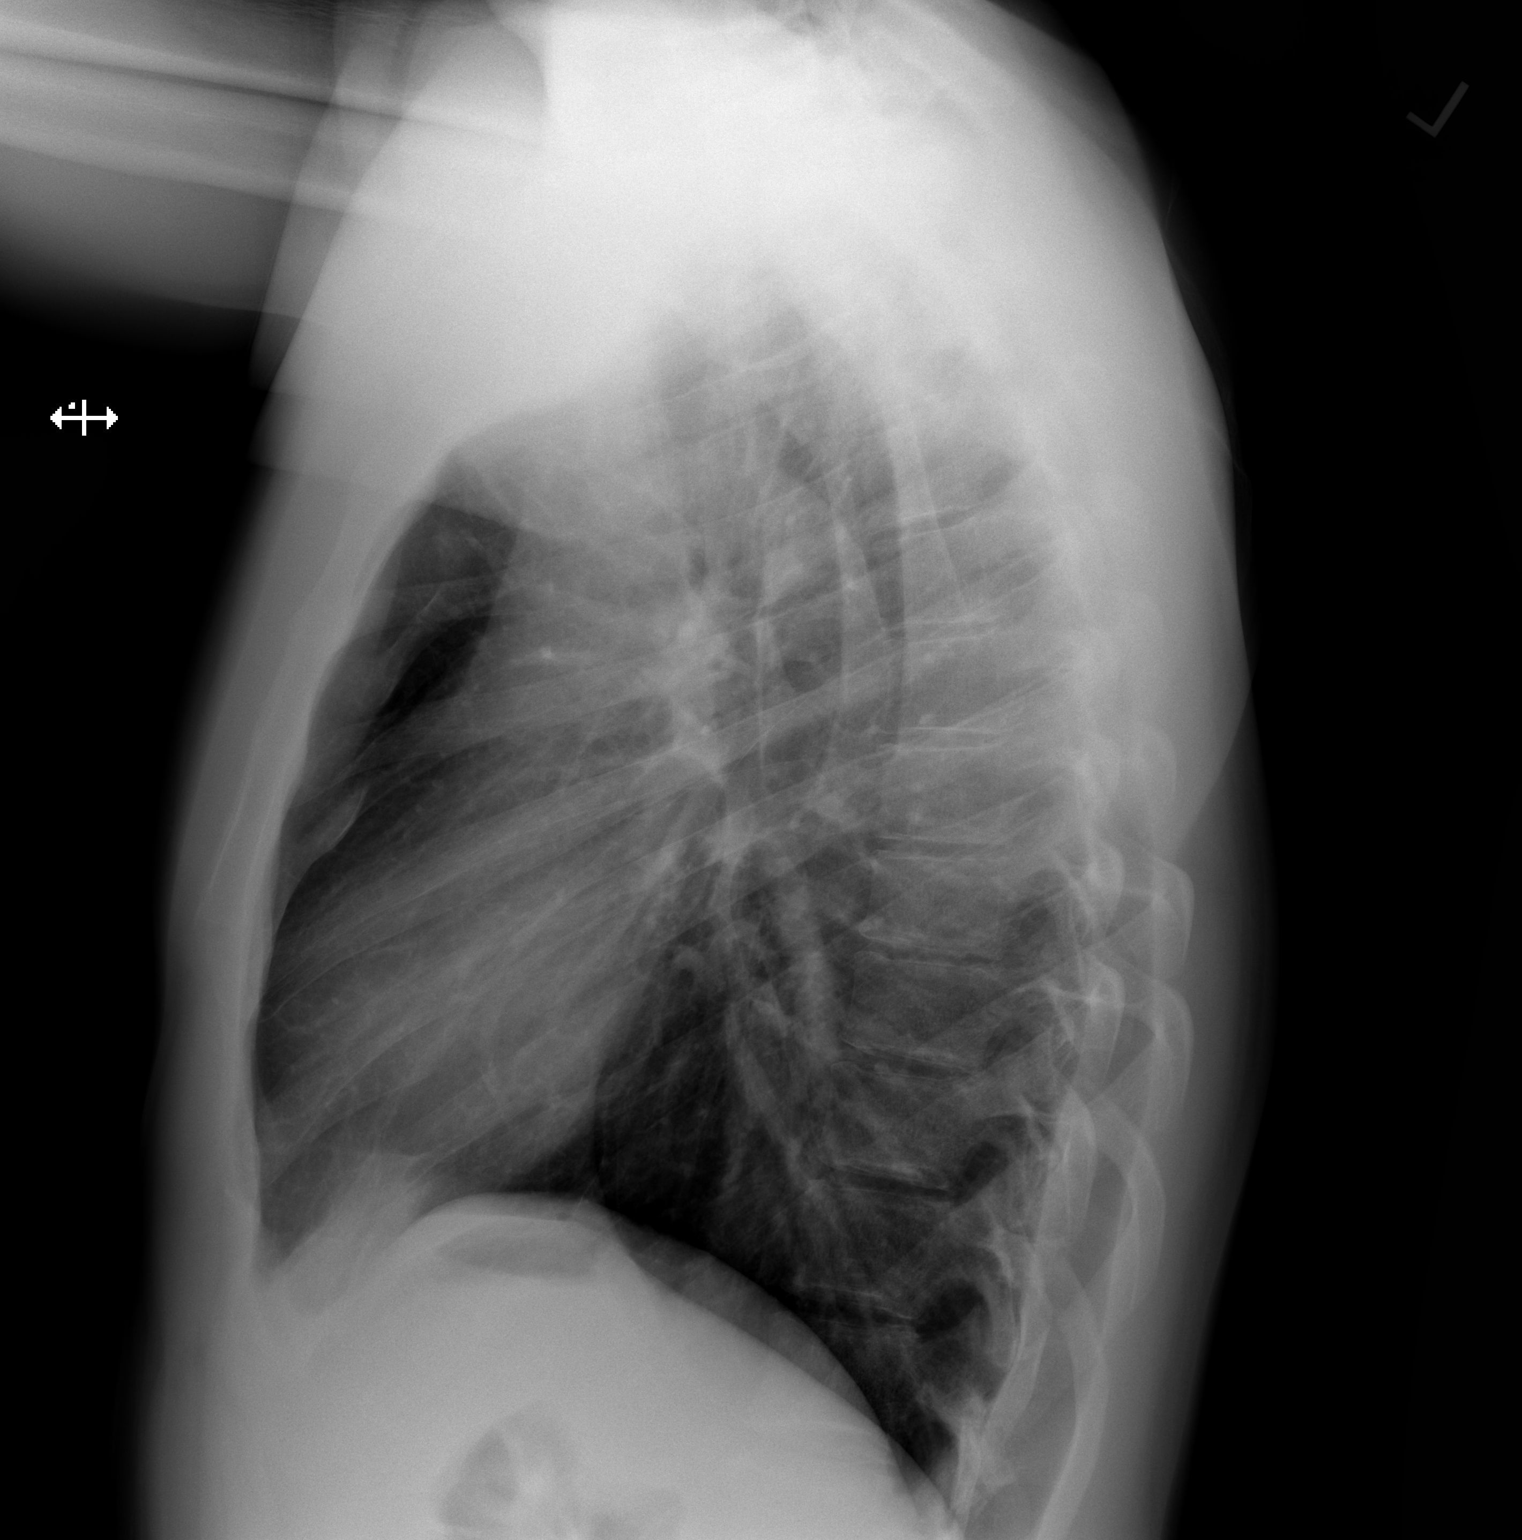

[2 of 2 positions shown; findings below may reference images not displayed]

FINDINGS: The heart size and mediastinal contours are within normal limits.
Both lungs are clear. The visualized skeletal structures are
unremarkable.
IMPRESSION: No active cardiopulmonary disease.

## 2022-02-27 ENCOUNTER — Encounter: Payer: 59 | Admitting: Adult Health
